# Patient Record
Sex: Female | Born: 1955
Health system: Southern US, Community
[De-identification: ages and names within clinical notes are randomized; demographics above are authoritative.]

## PROBLEM LIST (undated history)

## (undated) DIAGNOSIS — Z9889 Other specified postprocedural states: Secondary | ICD-10-CM

## (undated) DIAGNOSIS — R112 Nausea with vomiting, unspecified: Secondary | ICD-10-CM

## (undated) DIAGNOSIS — T7840XA Allergy, unspecified, initial encounter: Secondary | ICD-10-CM

## (undated) DIAGNOSIS — M1990 Unspecified osteoarthritis, unspecified site: Secondary | ICD-10-CM

## (undated) DIAGNOSIS — E785 Hyperlipidemia, unspecified: Secondary | ICD-10-CM

## (undated) DIAGNOSIS — K219 Gastro-esophageal reflux disease without esophagitis: Secondary | ICD-10-CM

## (undated) HISTORY — PX: COLONOSCOPY: SHX174

## (undated) HISTORY — PX: OTHER SURGICAL HISTORY: SHX169

## (undated) HISTORY — DX: Unspecified osteoarthritis, unspecified site: M19.90

## (undated) HISTORY — DX: Allergy, unspecified, initial encounter: T78.40XA

## (undated) HISTORY — PX: TONSILLECTOMY: SUR1361

## (undated) HISTORY — DX: Gastro-esophageal reflux disease without esophagitis: K21.9

## (undated) HISTORY — PX: JOINT REPLACEMENT: SHX530

## (undated) HISTORY — PX: MENISCUS REPAIR: SHX5179

---

## 2004-07-10 ENCOUNTER — Ambulatory Visit: Payer: Self-pay | Admitting: Unknown Physician Specialty

## 2006-02-11 ENCOUNTER — Ambulatory Visit: Payer: Self-pay | Admitting: Unknown Physician Specialty

## 2007-05-26 ENCOUNTER — Ambulatory Visit: Payer: Self-pay | Admitting: Unknown Physician Specialty

## 2007-05-30 ENCOUNTER — Ambulatory Visit: Payer: Self-pay | Admitting: Unknown Physician Specialty

## 2007-07-12 ENCOUNTER — Emergency Department: Payer: Self-pay | Admitting: Emergency Medicine

## 2008-09-25 ENCOUNTER — Ambulatory Visit: Payer: Self-pay | Admitting: Unknown Physician Specialty

## 2009-10-18 ENCOUNTER — Ambulatory Visit: Payer: Self-pay | Admitting: Unknown Physician Specialty

## 2011-02-10 ENCOUNTER — Ambulatory Visit: Payer: Self-pay | Admitting: Unknown Physician Specialty

## 2013-08-22 ENCOUNTER — Ambulatory Visit: Payer: Self-pay | Admitting: Specialist

## 2013-09-05 ENCOUNTER — Ambulatory Visit: Payer: Self-pay | Admitting: Specialist

## 2015-01-11 NOTE — Op Note (Signed)
PATIENT NAME:  [name], [name] MR#:  [id number] DATE OF BIRTH:  [birth year]  DATE OF PROCEDURE:  09/05/2013  PREOPERATIVE DIAGNOSIS: Medial meniscus tear, left knee.   POSTOPERATIVE DIAGNOSIS: Complex tear, mid and posterior horn, medial meniscus, left knee.   PROCEDURE: Arthroscopic partial medial meniscectomy, left knee.   SURGEON: Lucas Mallow, MD  ANESTHESIA: General.   COMPLICATIONS: None.   DESCRIPTION OF PROCEDURE: After adequate induction of general anesthesia, the left lower extremity is placed in the legholder in the usual manner for knee arthroscopy. The left knee and leg are thoroughly prepped with ChloraPrep and alcohol and draped in standard sterile fashion. Diagnostic arthroscopy is performed. Prior to this, 10 mL of Marcaine with epinephrine was infiltrated into the joint. Diagnostic arthroscopy demonstrated some mild chondromalacia on the anterior aspect of the femur. The lateral compartment is normal. The anterior cruciate ligament is normal. There is a moderate amount of synovial hypertrophy in the intercondylar notch. In the medial compartment, there is mild to moderate chondromalacia of the medial femoral condyle surface associated with a complex large tear of the midportion and posterior horns of the medial meniscus. Using a combination of the full radial resector and the ArthroWand, the torn portion of the meniscus was resected back to a stable rim. The probe is used to demonstrate that no further areas of tear are seen. The wound is thoroughly irrigated multiple times until clear. Portals are closed with 4-0 nylon. The joint is infiltrated with 15 mL of Marcaine with epinephrine and 4 mg of morphine. A soft bulky dressing is applied. The patient is returned to the recovery room in satisfactory condition, having tolerated the procedure quite well.  ____________________________ Lucas Mallow, MD ces:lb D: 09/05/2013 11:00:08 ET T: 09/05/2013 11:15:26  ET JOB#: [id number]  cc: Lucas Mallow, MD, <Dictator> Lucas Mallow MD ELECTRONICALLY SIGNED 09/07/2013 12:32

## 2016-07-20 DIAGNOSIS — Z96659 Presence of unspecified artificial knee joint: Secondary | ICD-10-CM | POA: Insufficient documentation

## 2016-12-16 NOTE — Progress Notes (Signed)
Subjective:    Patient ID: Ellen Montoya, female    DOB: 11-Jan-1956, 61 y.o.   MRN: [id number]  CC: EDIA PURSIFULL is a 61 y.o. female who presents today to establish care.    HPI: New patient. Bounced after [employer] retired.   Arthritis- primarily  left knee ( had partial left knee replacement) and also in her hands. Feels stiff, especially with stairs. No swelling, fever.   Epigastric burning - noticed in pilates when laying on stomach, takes priolosec PRN with resolve. NO CP, sob, unintentional weight loss, or trouble swallowing pills.   Allergies- controlled; follows with ENT  Will return for CPE, pap, and labs             HISTORY:  Past Medical History:  Diagnosis Date  . Allergy   . Arthritis    No past surgical history on file. Family History  Problem Relation Age of Onset  . Heart disease Father   . Heart disease Maternal Grandmother     Allergies: Patient has no known allergies. No current outpatient prescriptions on file prior to visit.   No current facility-administered medications on file prior to visit.     Social History  Substance Use Topics  . Smoking status: Never Smoker  . Smokeless tobacco: Never Used  . Alcohol use No    Review of Systems  Constitutional: Negative for chills and fever.  Respiratory: Negative for cough and shortness of breath.   Cardiovascular: Negative for chest pain and palpitations.  Gastrointestinal: Negative for nausea and vomiting.  Musculoskeletal: Negative for joint swelling.      Objective:    BP 122/68 (BP Location: Left Arm, Patient Position: Sitting, Cuff Size: Normal)   Pulse 88   Temp 98.2 F (36.8 C) (Oral)   Resp 16   Ht 5\' 4"  (1.626 m)   Wt 148 lb 4 oz (67.2 kg)   SpO2 97%   BMI 25.45 kg/m  BP Readings from Last 3 Encounters:  12/17/16 122/68   Wt Readings from Last 3 Encounters:  12/17/16 148 lb 4 oz (67.2 kg)    Physical Exam  Constitutional: She appears well-developed and  well-nourished.  Eyes: Conjunctivae are normal.  Cardiovascular: Normal rate, regular rhythm, normal heart sounds and normal pulses.   Pulmonary/Chest: Effort normal and breath sounds normal. She has no wheezes. She has no rhonchi. She has no rales.  Neurological: She is alert.  Skin: Skin is warm and dry.  Psychiatric: She has a normal mood and affect. Her speech is normal and behavior is normal. Thought content normal.  Vitals reviewed.      Assessment & Plan:   Problem List Items Addressed This Visit      Respiratory   Seasonal allergies    Follows with Dr Tami Ribas. Controlled on current regimen        Digestive   GERD (gastroesophageal reflux disease)    Advised zantac for prn use over PPI if symptoms can be controlled. Discussed lifestyle modifications        Musculoskeletal and Integument   Osteoarthritis    General of knee and hands. Trial mobic. Stop otc nsaids      Relevant Medications   meloxicam (MOBIC) 7.5 MG tablet     Other   Screening for breast cancer - Primary    Ordered ; patient will schedule      Relevant Orders   MM DIGITAL SCREENING BILATERAL   Encounter for immunization   Relevant Orders  Flu Vaccine QUAD 36+ mos IM (Completed)       I have discontinued Ms. Demary's naproxen sodium and omeprazole. I am also having her start on meloxicam. Additionally, I am having her maintain her fluticasone, ADVAIR DISKUS, cetirizine, and calcium carbonate.   Meds ordered this encounter  Medications  . DISCONTD: naproxen sodium (ANAPROX) 220 MG tablet    Sig: Take 220 mg by mouth 2 (two) times daily with a meal.  . DISCONTD: omeprazole (PRILOSEC) 20 MG capsule    Sig: Take 20 mg by mouth daily.  . fluticasone (FLONASE) 50 MCG/ACT nasal spray    Sig: SPRAY ONCE IN EACH NOSTRIL ONCE DAILY    Refill:  10  . ADVAIR DISKUS 250-50 MCG/DOSE AEPB    Sig: INHALE 1 PUFF INTO THE LUNGS TWICE A DAY    Refill:  5  . cetirizine (ZYRTEC) 10 MG tablet    Sig:  Take 10 mg by mouth daily.  . calcium carbonate (OS-CAL) 1250 (500 Ca) MG chewable tablet    Sig: Chew 1 tablet by mouth daily.  . meloxicam (MOBIC) 7.5 MG tablet    Sig: Take 1 tablet (7.5 mg total) by mouth daily.    Dispense:  90 tablet    Refill:  0    Order Specific Question:   Supervising Provider    Answer:   Crecencio Mc [[id number]]    Return precautions given.   Risks, benefits, and alternatives of the medications and treatment plan prescribed today were discussed, and patient expressed understanding.   Education regarding symptom management and diagnosis given to patient on AVS.  Continue to follow with Mable Paris, FNP for routine health maintenance.   Ellen Montoya and I agreed with plan.   Mable Paris, FNP

## 2016-12-17 ENCOUNTER — Ambulatory Visit (INDEPENDENT_AMBULATORY_CARE_PROVIDER_SITE_OTHER): Payer: BLUE CROSS/BLUE SHIELD | Admitting: Family

## 2016-12-17 ENCOUNTER — Encounter: Payer: Self-pay | Admitting: Family

## 2016-12-17 ENCOUNTER — Telehealth: Payer: Self-pay

## 2016-12-17 VITALS — BP 122/68 | HR 88 | Temp 98.2°F | Resp 16 | Ht 64.0 in | Wt 148.2 lb

## 2016-12-17 DIAGNOSIS — Z0000 Encounter for general adult medical examination without abnormal findings: Secondary | ICD-10-CM

## 2016-12-17 DIAGNOSIS — M1990 Unspecified osteoarthritis, unspecified site: Secondary | ICD-10-CM | POA: Insufficient documentation

## 2016-12-17 DIAGNOSIS — Z1239 Encounter for other screening for malignant neoplasm of breast: Secondary | ICD-10-CM | POA: Insufficient documentation

## 2016-12-17 DIAGNOSIS — K219 Gastro-esophageal reflux disease without esophagitis: Secondary | ICD-10-CM

## 2016-12-17 DIAGNOSIS — J302 Other seasonal allergic rhinitis: Secondary | ICD-10-CM | POA: Diagnosis not present

## 2016-12-17 DIAGNOSIS — Z23 Encounter for immunization: Secondary | ICD-10-CM

## 2016-12-17 DIAGNOSIS — M1711 Unilateral primary osteoarthritis, right knee: Secondary | ICD-10-CM | POA: Diagnosis not present

## 2016-12-17 DIAGNOSIS — Z1231 Encounter for screening mammogram for malignant neoplasm of breast: Secondary | ICD-10-CM | POA: Diagnosis not present

## 2016-12-17 HISTORY — DX: Encounter for general adult medical examination without abnormal findings: Z00.00

## 2016-12-17 MED ORDER — MELOXICAM 7.5 MG PO TABS: 7.5000 mg | ORAL_TABLET | Freq: Every day | ORAL | 0 refills | Status: DC

## 2016-12-17 NOTE — Assessment & Plan Note (Signed)
Ordered; patient will schedule.  

## 2016-12-17 NOTE — Patient Instructions (Addendum)
Try zantac over the omeprazole and see if acid reflux symptoms are controlled  Trial of mobic for osteoarthritis. You may stop over the counter meds- aleve, naprosyn.  Follow up for physical   We placed a referral. Mammogram this year. I asked that you call one the below locations and schedule this when it is convenient for you.   If you have dense breasts, you may ask for 3D mammogram over the traditional 2D mammogram as new evidence suggest 3D is superior. Please note that NOT all insurance companies cover 3D and you may have to pay a higher copay. You may call your insurance company to further clarify your benefits.   Options for [place]  [place], [place]  * [facility] [place] * Note if you ask for 3D mammogram at this location, you must request [place], [place] location*

## 2016-12-17 NOTE — Telephone Encounter (Signed)
error 

## 2016-12-17 NOTE — Assessment & Plan Note (Signed)
General of knee and hands. Trial mobic. Stop otc nsaids

## 2016-12-17 NOTE — Assessment & Plan Note (Signed)
Advised zantac for prn use over PPI if symptoms can be controlled. Discussed lifestyle modifications

## 2016-12-17 NOTE — Assessment & Plan Note (Addendum)
Follows with Dr Tami Ribas. Controlled on current regimen

## 2016-12-17 NOTE — Progress Notes (Signed)
Pre visit review using our clinic review tool, if applicable. No additional management support is needed unless otherwise documented below in the visit note. 

## 2017-01-18 ENCOUNTER — Ambulatory Visit
Admission: RE | Admit: 2017-01-18 | Discharge: 2017-01-18 | Disposition: A | Discharge status: Home / Self Care | Payer: BLUE CROSS/BLUE SHIELD | Source: Ambulatory Visit | Attending: Family | Admitting: Family

## 2017-01-18 DIAGNOSIS — Z1239 Encounter for other screening for malignant neoplasm of breast: Secondary | ICD-10-CM

## 2017-01-18 DIAGNOSIS — Z1231 Encounter for screening mammogram for malignant neoplasm of breast: Secondary | ICD-10-CM | POA: Diagnosis not present

## 2017-01-21 ENCOUNTER — Other Ambulatory Visit (HOSPITAL_COMMUNITY)
Admission: RE | Admit: 2017-01-21 | Discharge: 2017-01-21 | Disposition: A | Discharge status: Home / Self Care | Payer: BLUE CROSS/BLUE SHIELD | Source: Ambulatory Visit | Attending: Family | Admitting: Family

## 2017-01-21 ENCOUNTER — Ambulatory Visit (INDEPENDENT_AMBULATORY_CARE_PROVIDER_SITE_OTHER): Payer: BLUE CROSS/BLUE SHIELD | Admitting: Family

## 2017-01-21 ENCOUNTER — Inpatient Hospital Stay
Admission: RE | Admit: 2017-01-21 | Discharge: 2017-01-21 | Disposition: A | Discharge status: Home / Self Care | Payer: Self-pay | Source: Ambulatory Visit | Attending: *Deleted | Admitting: *Deleted

## 2017-01-21 ENCOUNTER — Other Ambulatory Visit: Payer: Self-pay | Admitting: *Deleted

## 2017-01-21 ENCOUNTER — Encounter: Payer: Self-pay | Admitting: Family

## 2017-01-21 VITALS — BP 128/78 | HR 88 | Temp 98.0°F | Ht 64.0 in | Wt 147.0 lb

## 2017-01-21 DIAGNOSIS — Z9289 Personal history of other medical treatment: Secondary | ICD-10-CM

## 2017-01-21 DIAGNOSIS — Z0000 Encounter for general adult medical examination without abnormal findings: Secondary | ICD-10-CM | POA: Insufficient documentation

## 2017-01-21 NOTE — Progress Notes (Signed)
Subjective:    Patient ID: Ellen Montoya, female    DOB: 1956-06-13, 61 y.o.   MRN: [id number]  CC: Ellen Montoya is a 61 y.o. female who presents today for physical exam.    HPI: Feeling well.  No compliants today.     Colorectal Cancer Screening: 12/2010 per patient. Unsure when she is due for repeat.  Breast Cancer Screening: Mammogram UTD Cervical Cancer Screening: due Bone Health screening/DEXA for 65+: No increased fracture risk. Defer screening at this time. Lung Cancer Screening: Doesn't have 30 year pack year history and age > 14 years.       Tetanus - due        Hepatitis C screening - Candidate for; will consider once she calls insurance company HIV Screening- Candidate for ; will consider once she calls insurance company Labs: Screening labs today. Exercise: Gets regular exercise.  Alcohol use: No Smoking/tobacco use: Nonsmoker.  Regular dental exams: UTD Wears seat belt: Yes. Skin: follows with derm   HISTORY:  Past Medical History:  Diagnosis Date  . Allergy   . Arthritis     No past surgical history on file. Family History  Problem Relation Age of Onset  . Heart disease Father     thinks a MI  . Heart disease Maternal Grandmother   . Leukemia Mother   . Ovarian cancer Neg Hx   . Colon cancer Neg Hx       ALLERGIES: Patient has no known allergies.  Current Outpatient Prescriptions on File Prior to Visit  Medication Sig Dispense Refill  . ADVAIR DISKUS 250-50 MCG/DOSE AEPB INHALE 1 PUFF INTO THE LUNGS TWICE A DAY  5  . calcium carbonate (OS-CAL) 1250 (500 Ca) MG chewable tablet Chew 1 tablet by mouth daily.    . cetirizine (ZYRTEC) 10 MG tablet Take 10 mg by mouth daily.    . fluticasone (FLONASE) 50 MCG/ACT nasal spray SPRAY ONCE IN EACH NOSTRIL ONCE DAILY  10  . meloxicam (MOBIC) 7.5 MG tablet Take 1 tablet (7.5 mg total) by mouth daily. 90 tablet 0   No current facility-administered medications on file prior to visit.     Social  History  Substance Use Topics  . Smoking status: Never Smoker  . Smokeless tobacco: Never Used  . Alcohol use No    Review of Systems  Constitutional: Negative for chills, fever and unexpected weight change.  HENT: Negative for congestion.   Respiratory: Negative for cough.   Cardiovascular: Negative for chest pain, palpitations and leg swelling.  Gastrointestinal: Negative for nausea and vomiting.  Musculoskeletal: Negative for arthralgias and myalgias.  Skin: Negative for rash.  Neurological: Negative for headaches.  Hematological: Negative for adenopathy.  Psychiatric/Behavioral: Negative for confusion.      Objective:    BP 128/78   Pulse 88   Temp 98 F (36.7 C) (Oral)   Ht 5\' 4"  (1.626 m)   Wt 147 lb (66.7 kg)   SpO2 96%   BMI 25.23 kg/m   BP Readings from Last 3 Encounters:  01/21/17 128/78  12/17/16 122/68   Wt Readings from Last 3 Encounters:  01/21/17 147 lb (66.7 kg)  12/17/16 148 lb 4 oz (67.2 kg)    Physical Exam  Constitutional: She appears well-developed and well-nourished.  Eyes: Conjunctivae are normal.  Neck: No thyroid mass and no thyromegaly present.  Cardiovascular: Normal rate, regular rhythm, normal heart sounds and normal pulses.   Pulmonary/Chest: Effort normal and breath sounds normal. She  has no wheezes. She has no rhonchi. She has no rales. Right breast exhibits no inverted nipple, no mass, no nipple discharge, no skin change and no tenderness. Left breast exhibits no inverted nipple, no mass, no nipple discharge, no skin change and no tenderness. Breasts are symmetrical.  No masses or asymmetry appreciated during CBE.  Genitourinary: Uterus is not enlarged, not fixed and not tender. Cervix exhibits no motion tenderness, no discharge and no friability. Right adnexum displays no mass, no tenderness and no fullness. Left adnexum displays no mass, no tenderness and no fullness.  Genitourinary Comments: Pap performed. No CMT. Unable to  appreciated ovaries.  Lymphadenopathy:       Head (right side): No submental, no submandibular, no tonsillar, no preauricular, no posterior auricular and no occipital adenopathy present.       Head (left side): No submental, no submandibular, no tonsillar, no preauricular, no posterior auricular and no occipital adenopathy present.       Right cervical: No superficial cervical, no deep cervical and no posterior cervical adenopathy present.      Left cervical: No superficial cervical, no deep cervical and no posterior cervical adenopathy present.    She has no axillary adenopathy.       Right axillary: No pectoral and no lateral adenopathy present.       Left axillary: No pectoral and no lateral adenopathy present. Neurological: She is alert.  Skin: Skin is warm and dry.  Psychiatric: She has a normal mood and affect. Her speech is normal and behavior is normal. Thought content normal.  Vitals reviewed.      Assessment & Plan:   Problem List Items Addressed This Visit      Other   Routine physical examination - Primary    CBE and pap performed. Encouraged Tdap, [employer] at [employer] [facility]. Screening labs ordered. Will let me know when colonoscopy is due ( cannot see report). Encouraged continued exercise.       Relevant Orders   CBC with Differential/Platelet   Comprehensive metabolic panel   Hemoglobin A1c   Lipid panel   TSH   VITAMIN D 25 Hydroxy (Vit-D Deficiency, Fractures)   Cytology - PAP       I am having Ellen Montoya maintain her fluticasone, ADVAIR DISKUS, cetirizine, calcium carbonate, and meloxicam.   No orders of the defined types were placed in this encounter.   Return precautions given.   Risks, benefits, and alternatives of the medications and treatment plan prescribed today were discussed, and patient expressed understanding.   Education regarding symptom management and diagnosis given to patient on AVS.   Continue to follow with Ellen Paris, Ellen Montoya for  routine health maintenance.   Ellen Montoya and I agreed with plan.   Ellen Paris, Ellen Montoya

## 2017-01-21 NOTE — Assessment & Plan Note (Signed)
CBE and pap performed. Encouraged Tdap, [employer] at [facility]. Screening labs ordered. Will let me know when colonoscopy is due ( cannot see report). Encouraged continued exercise.

## 2017-01-21 NOTE — Patient Instructions (Addendum)
Please let me know when your next colonoscopy is due.   Tdap and Shingrex at your local pharmacy  Please let me know when you come for fasting  Labs if you would like to include HIV and hepatitis C screen.     Health Maintenance for Postmenopausal Women Menopause is a normal process in which your reproductive ability comes to an end. This process happens gradually over a span of months to years, usually between the ages of 19 and 23. Menopause is complete when you have missed 12 consecutive menstrual periods. It is important to talk with your health care provider about some of the most common conditions that affect postmenopausal women, such as heart disease, cancer, and bone loss (osteoporosis). Adopting a healthy lifestyle and getting preventive care can help to promote your health and wellness. Those actions can also lower your chances of developing some of these common conditions. What should I know about menopause? During menopause, you may experience a number of symptoms, such as:  Moderate-to-severe hot flashes.  Night sweats.  Decrease in sex drive.  Mood swings.  Headaches.  Tiredness.  Irritability.  Memory problems.  Insomnia. Choosing to treat or not to treat menopausal changes is an individual decision that you make with your health care provider. What should I know about hormone replacement therapy and supplements? Hormone therapy products are effective for treating symptoms that are associated with menopause, such as hot flashes and night sweats. Hormone replacement carries certain risks, especially as you become older. If you are thinking about using estrogen or estrogen with progestin treatments, discuss the benefits and risks with your health care provider. What should I know about heart disease and stroke? Heart disease, heart attack, and stroke become more likely as you age. This may be due, in part, to the hormonal changes that your body experiences during  menopause. These can affect how your body processes dietary fats, triglycerides, and cholesterol. Heart attack and stroke are both medical emergencies. There are many things that you can do to help prevent heart disease and stroke:  Have your blood pressure checked at least every 1-2 years. High blood pressure causes heart disease and increases the risk of stroke.  If you are 8-31 years old, ask your health care provider if you should take aspirin to prevent a heart attack or a stroke.  Do not use any tobacco products, including cigarettes, chewing tobacco, or electronic cigarettes. If you need help quitting, ask your health care provider.  It is important to eat a healthy diet and maintain a healthy weight.  Be sure to include plenty of vegetables, fruits, low-fat dairy products, and lean protein.  Avoid eating foods that are high in solid fats, added sugars, or salt (sodium).  Get regular exercise. This is one of the most important things that you can do for your health.  Try to exercise for at least 150 minutes each week. The type of exercise that you do should increase your heart rate and make you sweat. This is known as moderate-intensity exercise.  Try to do strengthening exercises at least twice each week. Do these in addition to the moderate-intensity exercise.  Know your numbers.Ask your health care provider to check your cholesterol and your blood glucose. Continue to have your blood tested as directed by your health care provider. What should I know about cancer screening? There are several types of cancer. Take the following steps to reduce your risk and to catch any cancer development as early as  possible. Breast Cancer  Practice breast self-awareness.  This means understanding how your breasts normally appear and feel.  It also means doing regular breast self-exams. Let your health care provider know about any changes, no matter how small.  If you are 7 or older, have  a clinician do a breast exam (clinical breast exam or CBE) every year. Depending on your age, family history, and medical history, it may be recommended that you also have a yearly breast X-ray (mammogram).  If you have a family history of breast cancer, talk with your health care provider about genetic screening.  If you are at high risk for breast cancer, talk with your health care provider about having an MRI and a mammogram every year.  Breast cancer (BRCA) gene test is recommended for women who have family members with BRCA-related cancers. Results of the assessment will determine the need for genetic counseling and BRCA1 and for BRCA2 testing. BRCA-related cancers include these types:  Breast. This occurs in males or females.  Ovarian.  Tubal. This may also be called fallopian tube cancer.  Cancer of the abdominal or pelvic lining (peritoneal cancer).  Prostate.  Pancreatic. Cervical, Uterine, and Ovarian Cancer  Your health care provider may recommend that you be screened regularly for cancer of the pelvic organs. These include your ovaries, uterus, and vagina. This screening involves a pelvic exam, which includes checking for microscopic changes to the surface of your cervix (Pap test).  For women ages 21-65, health care providers may recommend a pelvic exam and a Pap test every three years. For women ages 62-65, they may recommend the Pap test and pelvic exam, combined with testing for human papilloma virus (HPV), every five years. Some types of HPV increase your risk of cervical cancer. Testing for HPV may also be done on women of any age who have unclear Pap test results.  Other health care providers may not recommend any screening for nonpregnant women who are considered low risk for pelvic cancer and have no symptoms. Ask your health care provider if a screening pelvic exam is right for you.  If you have had past treatment for cervical cancer or a condition that could lead to  cancer, you need Pap tests and screening for cancer for at least 20 years after your treatment. If Pap tests have been discontinued for you, your risk factors (such as having a new sexual partner) need to be reassessed to determine if you should start having screenings again. Some women have medical problems that increase the chance of getting cervical cancer. In these cases, your health care provider may recommend that you have screening and Pap tests more often.  If you have a family history of uterine cancer or ovarian cancer, talk with your health care provider about genetic screening.  If you have vaginal bleeding after reaching menopause, tell your health care provider.  There are currently no reliable tests available to screen for ovarian cancer. Lung Cancer  Lung cancer screening is recommended for adults 70-58 years old who are at high risk for lung cancer because of a history of smoking. A yearly low-dose CT scan of the lungs is recommended if you:  Currently smoke.  Have a history of at least 30 pack-years of smoking and you currently smoke or have quit within the past 15 years. A pack-year is smoking an average of one pack of cigarettes per day for one year. Yearly screening should:  Continue until it has been 15 years since  you quit.  Stop if you develop a health problem that would prevent you from having lung cancer treatment. Colorectal Cancer  This type of cancer can be detected and can often be prevented.  Routine colorectal cancer screening usually begins at age 3 and continues through age 36.  If you have risk factors for colon cancer, your health care provider may recommend that you be screened at an earlier age.  If you have a family history of colorectal cancer, talk with your health care provider about genetic screening.  Your health care provider may also recommend using home test kits to check for hidden blood in your stool.  A small camera at the end of a tube  can be used to examine your colon directly (sigmoidoscopy or colonoscopy). This is done to check for the earliest forms of colorectal cancer.  Direct examination of the colon should be repeated every 5-10 years until age 50. However, if early forms of precancerous polyps or small growths are found or if you have a family history or genetic risk for colorectal cancer, you may need to be screened more often. Skin Cancer  Check your skin from head to toe regularly.  Monitor any moles. Be sure to tell your health care provider:  About any new moles or changes in moles, especially if there is a change in a mole's shape or color.  If you have a mole that is larger than the size of a pencil eraser.  If any of your family members has a history of skin cancer, especially at a young age, talk with your health care provider about genetic screening.  Always use sunscreen. Apply sunscreen liberally and repeatedly throughout the day.  Whenever you are outside, protect yourself by wearing long sleeves, pants, a wide-brimmed hat, and sunglasses. What should I know about osteoporosis? Osteoporosis is a condition in which bone destruction happens more quickly than new bone creation. After menopause, you may be at an increased risk for osteoporosis. To help prevent osteoporosis or the bone fractures that can happen because of osteoporosis, the following is recommended:  If you are 10-56 years old, get at least 1,000 mg of calcium and at least 600 mg of vitamin D per day.  If you are older than age 63 but younger than age 2, get at least 1,200 mg of calcium and at least 600 mg of vitamin D per day.  If you are older than age 82, get at least 1,200 mg of calcium and at least 800 mg of vitamin D per day. Smoking and excessive alcohol intake increase the risk of osteoporosis. Eat foods that are rich in calcium and vitamin D, and do weight-bearing exercises several times each week as directed by your health care  provider. What should I know about how menopause affects my mental health? Depression may occur at any age, but it is more common as you become older. Common symptoms of depression include:  Low or sad mood.  Changes in sleep patterns.  Changes in appetite or eating patterns.  Feeling an overall lack of motivation or enjoyment of activities that you previously enjoyed.  Frequent crying spells. Talk with your health care provider if you think that you are experiencing depression. What should I know about immunizations? It is important that you get and maintain your immunizations. These include:  Tetanus, diphtheria, and pertussis (Tdap) booster vaccine.  Influenza every year before the flu season begins.  Pneumonia vaccine.  Shingles vaccine. Your health care provider  may also recommend other immunizations. This information is not intended to replace advice given to you by your health care provider. Make sure you discuss any questions you have with your health care provider. Document Released: 10/30/2005 Document Revised: 03/27/2016 Document Reviewed: 06/11/2015 Elsevier Interactive Patient Education  2017 [employer].

## 2017-01-21 NOTE — Progress Notes (Signed)
Pre visit review using our clinic review tool, if applicable. No additional management support is needed unless otherwise documented below in the visit note. 

## 2017-01-25 LAB — CYTOLOGY - PAP: HPV: NOT DETECTED

## 2017-02-03 ENCOUNTER — Other Ambulatory Visit: Payer: Self-pay | Admitting: Family

## 2017-02-03 DIAGNOSIS — N952 Postmenopausal atrophic vaginitis: Secondary | ICD-10-CM

## 2017-02-03 MED ORDER — ESTRADIOL 0.1 MG/GM VA CREA: TOPICAL_CREAM | VAGINAL | 1 refills | Status: DC

## 2017-02-03 NOTE — Progress Notes (Unsigned)
Call pt  I have ordered 3 month supply vaginal estrogen  Please make repeat pap appt in 3 months

## 2017-02-04 ENCOUNTER — Telehealth: Payer: Self-pay | Admitting: *Deleted

## 2017-02-04 ENCOUNTER — Other Ambulatory Visit (INDEPENDENT_AMBULATORY_CARE_PROVIDER_SITE_OTHER): Payer: BLUE CROSS/BLUE SHIELD

## 2017-02-04 DIAGNOSIS — Z0000 Encounter for general adult medical examination without abnormal findings: Secondary | ICD-10-CM

## 2017-02-04 LAB — LIPID PANEL
Cholesterol: 185 mg/dL (ref 0–200)
HDL: 57.1 mg/dL (ref >39.00)
LDL Cholesterol: 102 mg/dL — ABNORMAL HIGH (ref 0–99)
NonHDL: 127.42
Total CHOL/HDL Ratio: 3
Triglycerides: 126 mg/dL (ref 0.0–149.0)
VLDL: 25.2 mg/dL (ref 0.0–40.0)

## 2017-02-04 LAB — CBC WITH DIFFERENTIAL/PLATELET
Basophils Absolute: 0.1 10*3/uL (ref 0.0–0.1)
Basophils Relative: 1.2 % (ref 0.0–3.0)
Eosinophils Absolute: 0.1 10*3/uL (ref 0.0–0.7)
Eosinophils Relative: 2 % (ref 0.0–5.0)
HCT: 40.2 % (ref 36.0–46.0)
Hemoglobin: 13.4 g/dL (ref 12.0–15.0)
Lymphocytes Relative: 26 % (ref 12.0–46.0)
Lymphs Abs: 1.6 10*3/uL (ref 0.7–4.0)
MCHC: 33.4 g/dL (ref 30.0–36.0)
MCV: 90 fl (ref 78.0–100.0)
Monocytes Absolute: 0.5 10*3/uL (ref 0.1–1.0)
Monocytes Relative: 8.4 % (ref 3.0–12.0)
Neutro Abs: 3.8 10*3/uL (ref 1.4–7.7)
Neutrophils Relative %: 62.4 % (ref 43.0–77.0)
Platelets: 146 10*3/uL — ABNORMAL LOW (ref 150.0–400.0)
RBC: 4.46 Mil/uL (ref 3.87–5.11)
RDW: 12.8 % (ref 11.5–15.5)
WBC: 6.1 10*3/uL (ref 4.0–10.5)

## 2017-02-04 LAB — VITAMIN D 25 HYDROXY (VIT D DEFICIENCY, FRACTURES): VITD: 37.92 ng/mL (ref 30.00–100.00)

## 2017-02-04 LAB — COMPREHENSIVE METABOLIC PANEL
ALT: 15 U/L (ref 0–35)
AST: 15 U/L (ref 0–37)
Albumin: 4.5 g/dL (ref 3.5–5.2)
Alkaline Phosphatase: 74 U/L (ref 39–117)
BUN: 15 mg/dL (ref 6–23)
CO2: 30 mEq/L (ref 19–32)
Calcium: 9.6 mg/dL (ref 8.4–10.5)
Chloride: 105 mEq/L (ref 96–112)
Creatinine, Ser: 0.7 mg/dL (ref 0.40–1.20)
GFR: 89.9 mL/min (ref >60.00)
Glucose, Bld: 103 mg/dL — ABNORMAL HIGH (ref 70–99)
Potassium: 4 mEq/L (ref 3.5–5.1)
Sodium: 140 mEq/L (ref 135–145)
Total Bilirubin: 0.9 mg/dL (ref 0.2–1.2)
Total Protein: 7.1 g/dL (ref 6.0–8.3)

## 2017-02-04 LAB — TSH: TSH: 1.82 u[IU]/mL (ref 0.35–4.50)

## 2017-02-04 LAB — HEMOGLOBIN A1C: Hgb A1c MFr Bld: 5.8 % (ref 4.6–6.5)

## 2017-02-04 NOTE — Progress Notes (Signed)
Patient has been notified.  Patient had no questions, comments, or concerns at this time.

## 2017-02-04 NOTE — Telephone Encounter (Signed)
See additional note, thanks 

## 2017-02-04 NOTE — Telephone Encounter (Signed)
Patient stated that she missed a call from [facility] this morning  Pt contact [phone]

## 2017-02-04 NOTE — Progress Notes (Signed)
Attempted to reach the patient, she had left her secondary number to call, left a VM.

## 2017-03-17 ENCOUNTER — Other Ambulatory Visit: Payer: Self-pay | Admitting: Family

## 2017-03-17 DIAGNOSIS — M1711 Unilateral primary osteoarthritis, right knee: Secondary | ICD-10-CM

## 2017-03-26 ENCOUNTER — Other Ambulatory Visit: Payer: Self-pay

## 2017-03-26 DIAGNOSIS — M1711 Unilateral primary osteoarthritis, right knee: Secondary | ICD-10-CM

## 2017-03-26 MED ORDER — MELOXICAM 7.5 MG PO TABS: 7.5000 mg | ORAL_TABLET | Freq: Every day | ORAL | 0 refills | Status: DC

## 2018-04-06 DIAGNOSIS — M533 Sacrococcygeal disorders, not elsewhere classified: Secondary | ICD-10-CM | POA: Insufficient documentation

## 2018-08-11 IMAGING — MG MM DIGITAL SCREENING BILAT W/ TOMO W/ CAD
8 of 12 series · 8 of 28 positions shown · non-contrast
Comparison: Previous exam(s).

CLINICAL DATA: Screening.

EXAM:
2D DIGITAL SCREENING BILATERAL MAMMOGRAM WITH CAD AND ADJUNCT TOMO

[L MLO synth-2D]
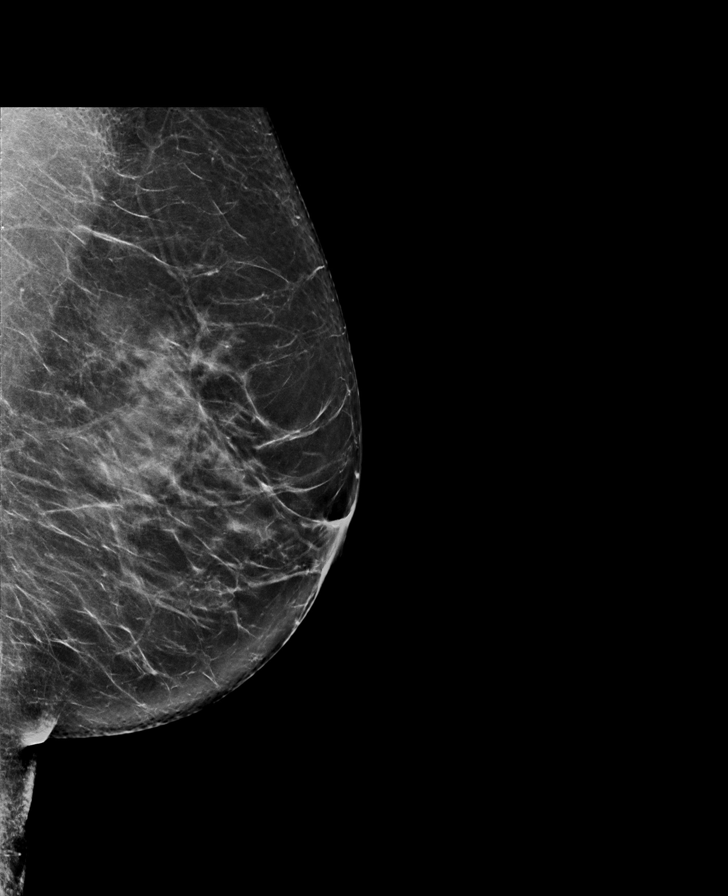

[R MLO]
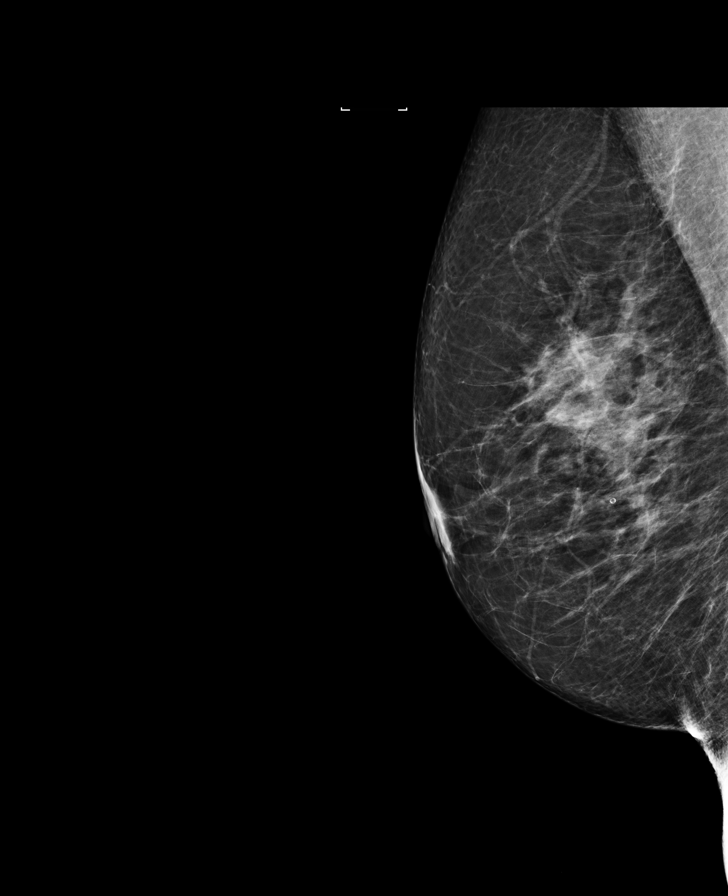

[L CC]
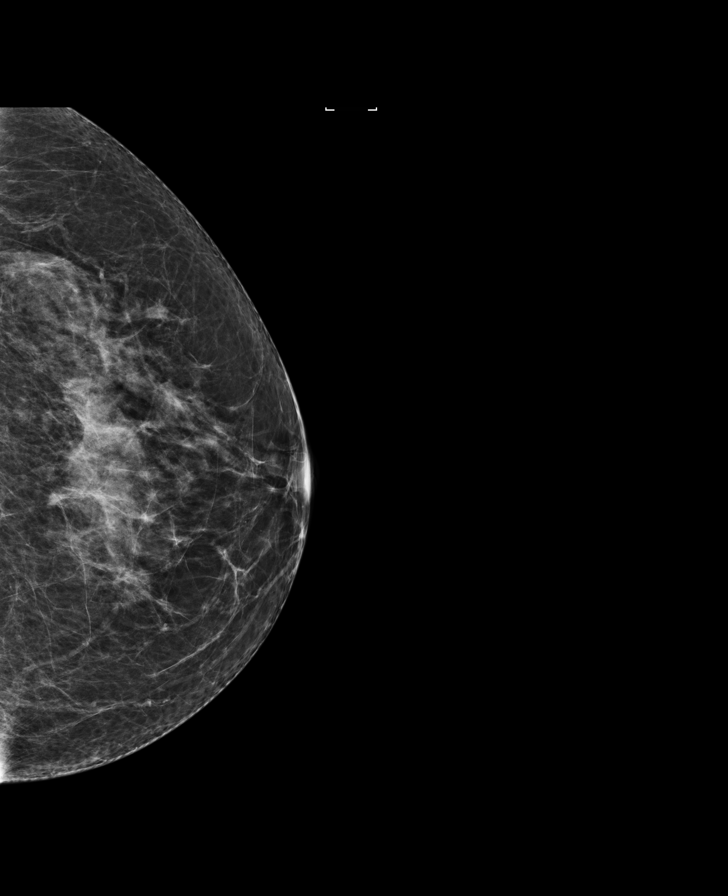

[R CC synth-2D]
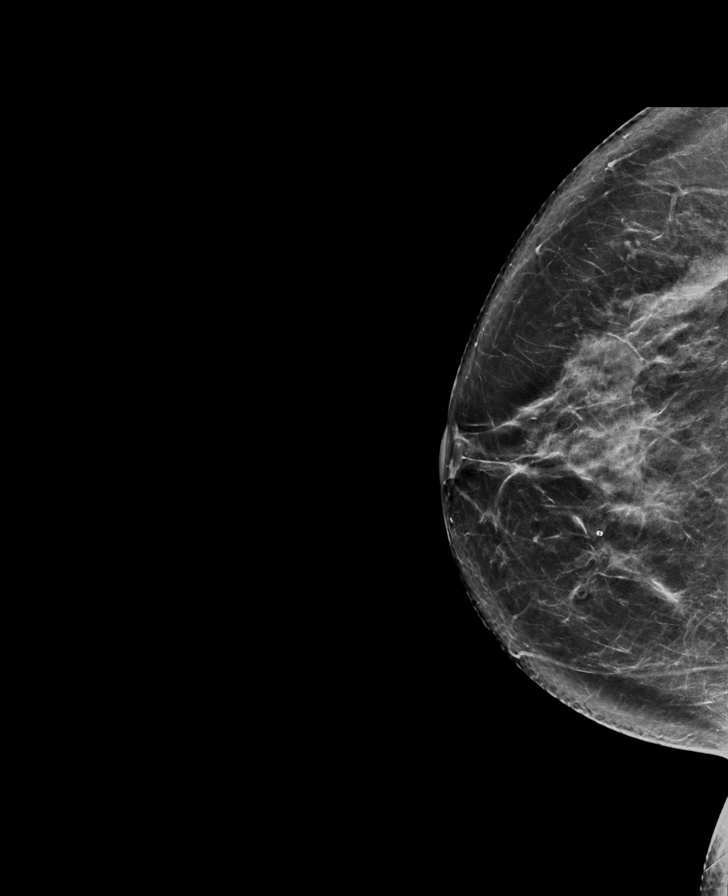

[L CC synth-2D]
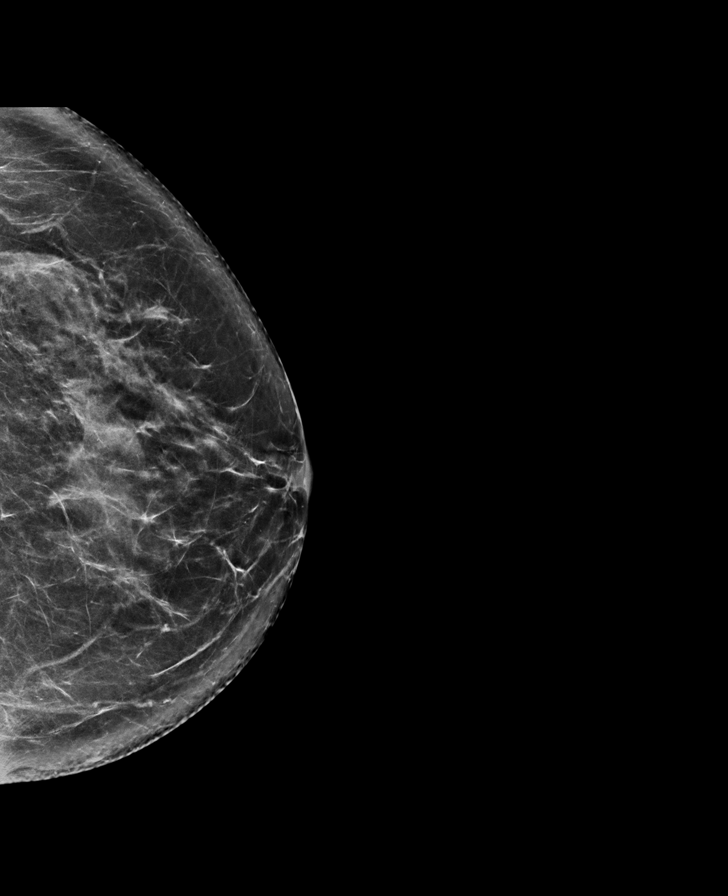

[R MLO synth-2D]
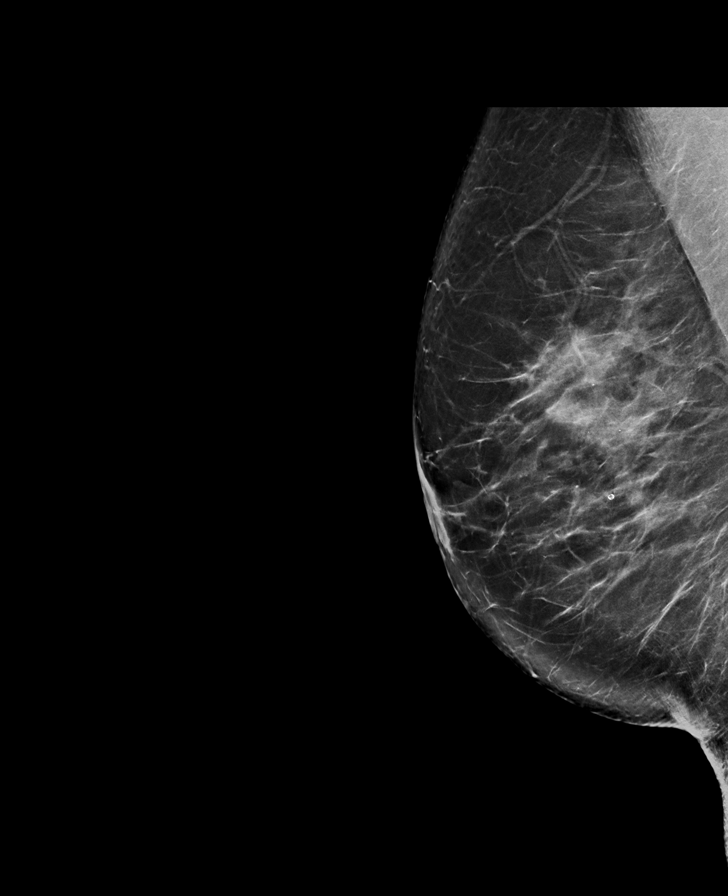

[R CC]
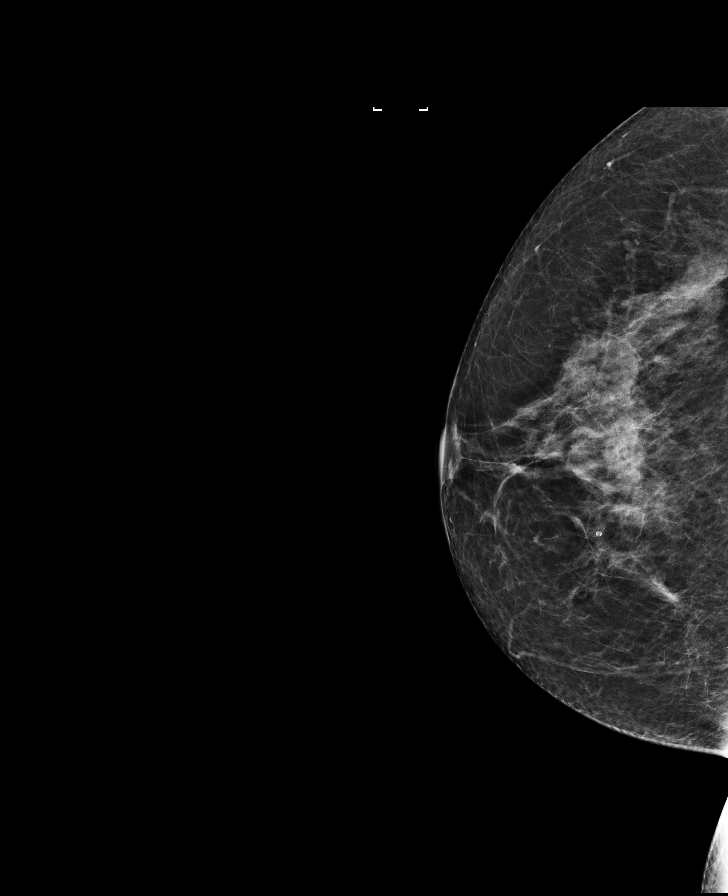

[L MLO]
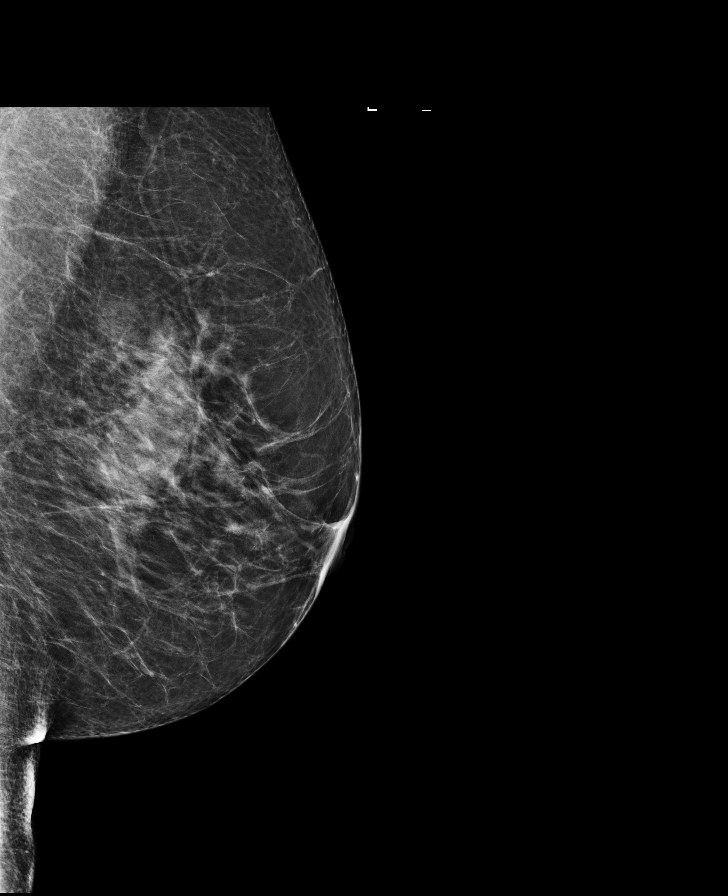

[8 of 28 positions shown; findings below may reference images not displayed]

ACR Breast Density Category c: The breast tissue is heterogeneously
dense, which may obscure small masses.
FINDINGS: There are no findings suspicious for malignancy. Images were
processed with CAD.
IMPRESSION: No mammographic evidence of malignancy. A result letter of this
screening mammogram will be mailed directly to the patient.

RECOMMENDATION:
Screening mammogram in one year. (Code:[id number])

BI-RADS CATEGORY  1: Negative.

## 2019-07-13 ENCOUNTER — Other Ambulatory Visit: Payer: Self-pay | Admitting: Family

## 2019-08-31 ENCOUNTER — Ambulatory Visit: Payer: Self-pay | Admitting: Physician Assistant

## 2019-09-08 ENCOUNTER — Ambulatory Visit: Payer: Self-pay | Admitting: Physician Assistant

## 2019-09-08 ENCOUNTER — Other Ambulatory Visit: Payer: Self-pay

## 2019-09-08 ENCOUNTER — Encounter: Payer: Self-pay | Admitting: Physician Assistant

## 2019-09-08 VITALS — BP 124/83 | HR 84 | Temp 97.1°F | Resp 16 | Ht 64.0 in | Wt 139.0 lb

## 2019-09-08 DIAGNOSIS — S83249A Other tear of medial meniscus, current injury, unspecified knee, initial encounter: Secondary | ICD-10-CM

## 2019-09-08 DIAGNOSIS — M1711 Unilateral primary osteoarthritis, right knee: Secondary | ICD-10-CM

## 2019-09-08 DIAGNOSIS — R29898 Other symptoms and signs involving the musculoskeletal system: Secondary | ICD-10-CM | POA: Insufficient documentation

## 2019-09-08 DIAGNOSIS — M25669 Stiffness of unspecified knee, not elsewhere classified: Secondary | ICD-10-CM | POA: Insufficient documentation

## 2019-09-08 DIAGNOSIS — K1370 Unspecified lesions of oral mucosa: Secondary | ICD-10-CM

## 2019-09-08 DIAGNOSIS — R269 Unspecified abnormalities of gait and mobility: Secondary | ICD-10-CM | POA: Insufficient documentation

## 2019-09-08 DIAGNOSIS — M6530 Trigger finger, unspecified finger: Secondary | ICD-10-CM | POA: Insufficient documentation

## 2019-09-08 DIAGNOSIS — M6780 Other specified disorders of synovium and tendon, unspecified site: Secondary | ICD-10-CM

## 2019-09-08 DIAGNOSIS — Z1231 Encounter for screening mammogram for malignant neoplasm of breast: Secondary | ICD-10-CM

## 2019-09-08 HISTORY — DX: Other specified disorders of synovium and tendon, unspecified site: M67.80

## 2019-09-08 HISTORY — DX: Trigger finger, unspecified finger: M65.30

## 2019-09-08 HISTORY — DX: Other tear of medial meniscus, current injury, unspecified knee, initial encounter: S83.249A

## 2019-09-08 MED ORDER — MELOXICAM 7.5 MG PO TABS: 7.5000 mg | ORAL_TABLET | Freq: Every day | ORAL | 0 refills | Status: DC

## 2019-09-08 NOTE — Progress Notes (Signed)
Patient: Ellen Montoya Female    DOB: 1956-01-17   63 y.o.   MRN: [id number] Visit Date: 09/08/2019  Today's Provider: Trinna Post, PA-C   Chief Complaint  Patient presents with  . New Patient visit   Subjective:     The patient is a 63 Y.O female that is being seen to esablish care.  Patient living in [place] with husband. She has 3 children and 2 grandchildren. She is retired.   She had a colonoscopy 8 years ago. PAP smear up to date.   She does have two lesions in her mouth which dentist recommended her to see a dermatologist. One has been there for many months on her lip and one has emerged on the inside of her mouth in the past several weeks.   She takes meloxicam sparingly for arthritis of her knee which she has had replaced in the past.   Allergies  Allergen Reactions  . Pollen Extract-Tree Extract [Pollen Extract]      Current Outpatient Medications:  .  meloxicam (MOBIC) 7.5 MG tablet, Take 1 tablet (7.5 mg total) by mouth daily., Disp: 90 tablet, Rfl: 0  Review of Systems  Constitutional: Negative.   HENT: Negative.   Eyes: Negative.   Respiratory: Negative.   Cardiovascular: Negative.   Gastrointestinal: Negative.   Endocrine: Negative.   Genitourinary: Negative.   Musculoskeletal: Positive for arthralgias.  Skin: Negative.   Allergic/Immunologic: Positive for environmental allergies.  Neurological: Negative.   Hematological: Negative.   Psychiatric/Behavioral: Negative.     Social History   Tobacco Use  . Smoking status: Never Smoker  . Smokeless tobacco: Never Used  Substance Use Topics  . Alcohol use: No      Objective:   BP (!) 144/84   Pulse 77   Temp (!) 97.1 F (36.2 C) (Temporal)   Resp 16   Ht 5\' 4"  (1.626 m)   Wt 139 lb (63 kg)   BMI 23.86 kg/m  Vitals:   09/08/19 1418  BP: (!) 144/84  Pulse: 77  Resp: 16  Temp: (!) 97.1 F (36.2 C)  TempSrc: Temporal  Weight: 139 lb (63 kg)  Height: 5\' 4"  (1.626 m)    Body mass index is 23.86 kg/m.   Physical Exam Constitutional:      Appearance: Normal appearance.  HENT:     Mouth/Throat:   Cardiovascular:     Rate and Rhythm: Normal rate and regular rhythm.     Heart sounds: Normal heart sounds.  Pulmonary:     Effort: Pulmonary effort is normal.     Breath sounds: Normal breath sounds.  Skin:    General: Skin is warm and dry.  Neurological:     Mental Status: She is alert and oriented to person, place, and time. Mental status is at baseline.  Psychiatric:        Mood and Affect: Mood normal.        Behavior: Behavior normal.      No results found for any visits on 09/08/19.     Assessment & Plan    1. Encounter for screening mammogram for malignant neoplasm of breast   2. Mouth lesion  Would recommend she see a dermatologist as well.  3. Osteoarthritis of right knee, unspecified osteoarthritis type  - meloxicam (MOBIC) 7.5 MG tablet; Take 1 tablet (7.5 mg total) by mouth daily.  Dispense: 90 tablet; Refill: 0  F/u next year for CPE and welcome to medicare.  The entirety of the information documented in the History of Present Illness, Review of Systems and Physical Exam were personally obtained by me. Portions of this information were initially documented by [facility], CMA and reviewed by me for thoroughness and accuracy.         Trinna Post, PA-C  [facility],

## 2019-09-13 NOTE — Patient Instructions (Signed)
Arthritis Arthritis means joint pain. It can also mean joint disease. A joint is a place where bones come together. There are more than 100 types of arthritis. What are the causes? This condition may be caused by:  Wear and tear of a joint. This is the most common cause.  A lot of acid in the blood, which leads to pain in the joint (gout).  Pain and swelling (inflammation) in a joint.  Infection of a joint.  Injuries in the joint.  A reaction to medicines (allergy). In some cases, the cause may not be known. What are the signs or symptoms? Symptoms of this condition include:  Redness at a joint.  Swelling at a joint.  Stiffness at a joint.  Warmth coming from the joint.  A fever.  A feeling of being sick. How is this treated? This condition may be treated with:  Treating the cause, if it is known.  Rest.  Raising (elevating) the joint.  Putting cold or hot packs on the joint.  Medicines to treat symptoms and reduce pain and swelling.  Shots of medicines (cortisone) into the joint. You may also be told to make changes in your life, such as doing exercises and losing weight. Follow these instructions at home: Medicines  Take over-the-counter and prescription medicines only as told by your doctor.  Do not take aspirin for pain if your doctor says that you may have gout. Activity  Rest your joint if your doctor tells you to.  Avoid activities that make the pain worse.  Exercise your joint regularly as told by your doctor. Try doing exercises like: ? Swimming. ? Water aerobics. ? Biking. ? Walking. Managing pain, stiffness, and swelling      If told, put ice on the affected area. ? Put ice in a plastic bag. ? Place a towel between your skin and the bag. ? Leave the ice on for 20 minutes, 2-3 times per day.  If your joint is swollen, raise (elevate) it above the level of your heart if told by your doctor.  If your joint feels stiff in the morning,  try taking a warm shower.  If told, put heat on the affected area. Do this as often as told by your doctor. Use the heat source that your doctor recommends, such as a moist heat pack or a heating pad. If you have diabetes, do not apply heat without asking your doctor. To apply heat: ? Place a towel between your skin and the heat source. ? Leave the heat on for 20-30 minutes. ? Remove the heat if your skin turns bright red. This is very important if you are unable to feel pain, heat, or cold. You may have a greater risk of getting burned. General instructions  Do not use any products that contain nicotine or tobacco, such as cigarettes, e-cigarettes, and chewing tobacco. If you need help quitting, ask your doctor.  Keep all follow-up visits as told by your doctor. This is important. Contact a doctor if:  The pain gets worse.  You have a fever. Get help right away if:  You have very bad pain in your joint.  You have swelling in your joint.  Your joint is red.  Many joints become painful and swollen.  You have very bad back pain.  Your leg is very weak.  You cannot control your pee (urine) or poop (stool). Summary  Arthritis means joint pain. It can also mean joint disease. A joint is a place   where bones come together.  The most common cause of this condition is wear and tear of a joint.  Symptoms of this condition include redness, swelling, or stiffness of the joint.  This condition is treated with rest, raising the joint, medicines, and putting cold or hot packs on the joint.  Follow your doctor's instructions about medicines, activity, exercises, and other home care treatments. This information is not intended to replace advice given to you by your health care provider. Make sure you discuss any questions you have with your health care provider. Document Released: 12/02/2009 Document Revised: 08/15/2018 Document Reviewed: 08/15/2018 Elsevier Patient Education  2020  [employer]  

## 2019-09-25 DIAGNOSIS — R69 Illness, unspecified: Secondary | ICD-10-CM | POA: Diagnosis not present

## 2020-04-01 DIAGNOSIS — R69 Illness, unspecified: Secondary | ICD-10-CM | POA: Diagnosis not present

## 2020-05-13 ENCOUNTER — Ambulatory Visit: Payer: Self-pay | Admitting: Physician Assistant

## 2020-05-20 NOTE — Progress Notes (Signed)
Complete physical exam   Patient: Ellen Montoya   DOB: August 06, 1956   64 y.o. Female  MRN: [id number] Visit Date: 05/21/2020  Today's healthcare provider: Trinna Post, PA-C   Chief Complaint  Patient presents with  . Annual Exam   Subjective    Ellen Montoya is a 64 y.o. female who presents today for a complete physical exam.  She reports consuming a general diet. The patient does not participate in regular exercise at present. She generally feels well. She reports sleeping well. She does not have additional problems to discuss today.   Colon Cancer screening - [facility] 11/2010, normal   Mammogram - last 2018  Pneumonia - due for first shot  Flu shot - no high dose today, pt will get it at pharmacy   Shingles shot - will call insurance  Dexa: Due    Past Medical History:  Diagnosis Date  . Allergy   . Arthritis   . GERD (gastroesophageal reflux disease)    Past Surgical History:  Procedure Laterality Date  . JOINT REPLACEMENT    . laprascopy     Social History   Socioeconomic History  . Marital status: Married    Spouse name: Not on file  . Number of children: Not on file  . Years of education: Not on file  . Highest education level: Not on file  Occupational History  . Not on file  Tobacco Use  . Smoking status: Never Smoker  . Smokeless tobacco: Never Used  Substance and Sexual Activity  . Alcohol use: No  . Drug use: No  . Sexual activity: Not on file  Other Topics Concern  . Not on file  Social History Narrative   From [place]      Married      3 -children      Work at [employer] Strain:   . Difficulty of Paying Living Expenses: Not on file  Food Insecurity:   . Worried About Charity fundraiser in the Last Year: Not on file  . Ran Out of Food in the Last Year: Not on file  Transportation Needs:   . Lack of Transportation (Medical): Not  on file  . Lack of Transportation (Non-Medical): Not on file  Physical Activity:   . Days of Exercise per Week: Not on file  . Minutes of Exercise per Session: Not on file  Stress:   . Feeling of Stress : Not on file  Social Connections:   . Frequency of Communication with Friends and Family: Not on file  . Frequency of Social Gatherings with Friends and Family: Not on file  . Attends Religious Services: Not on file  . Active Member of Clubs or Organizations: Not on file  . Attends Archivist Meetings: Not on file  . Marital Status: Not on file  Intimate Partner Violence:   . Fear of Current or Ex-Partner: Not on file  . Emotionally Abused: Not on file  . Physically Abused: Not on file  . Sexually Abused: Not on file   Family Status  Relation Name Status  . Father  Alive  . MGM  (Not Specified)  . Mother  Deceased  . Neg Hx  (Not Specified)   Family History  Problem Relation Age of Onset  . Heart disease Father        thinks a MI  .  Heart disease Maternal Grandmother   . Leukemia Mother   . Ovarian cancer Neg Hx   . Colon cancer Neg Hx    Allergies  Allergen Reactions  . Pollen Extract-Tree Extract [Pollen Extract]     Patient Care Team: Paulene Floor as PCP - General (Physician Assistant)   Medications: Outpatient Medications Prior to Visit  Medication Sig  . meloxicam (MOBIC) 7.5 MG tablet Take 1 tablet (7.5 mg total) by mouth daily.   No facility-administered medications prior to visit.    Review of Systems  Constitutional: Negative.   HENT: Negative.   Eyes: Negative.   Respiratory: Negative.   Cardiovascular: Negative.   Gastrointestinal: Negative.   Endocrine: Negative.   Genitourinary: Negative.   Musculoskeletal: Negative.   Skin: Negative.   Allergic/Immunologic: Negative.   Neurological: Negative.   Hematological: Negative.   Psychiatric/Behavioral: Negative.      {Heme  Chem  Endocrine  Serology  Results Review  (optional):23779::" "}  Objective    BP 125/78 (BP Location: Right Arm)   Pulse 82   Temp 98.8 F (37.1 C)   Ht 5\' 4"  (1.626 m)   Wt 139 lb 12.8 oz (63.4 kg)   BMI 24.00 kg/m    Physical Exam Constitutional:      Appearance: Normal appearance.  HENT:     Right Ear: Tympanic membrane normal.     Left Ear: Tympanic membrane normal.  Cardiovascular:     Rate and Rhythm: Normal rate and regular rhythm.     Pulses: Normal pulses.     Heart sounds: Normal heart sounds.  Pulmonary:     Effort: Pulmonary effort is normal.     Breath sounds: Normal breath sounds.  Skin:    General: Skin is warm and dry.  Neurological:     Mental Status: She is alert and oriented to person, place, and time. Mental status is at baseline.  Psychiatric:        Mood and Affect: Mood normal.        Behavior: Behavior normal.      Last depression screening scores PHQ 2/9 Scores 09/08/2019 01/21/2017  PHQ - 2 Score 0 0  PHQ- 9 Score 0 -   Last fall risk screening Fall Risk  09/08/2019  Falls in the past year? 1  Number falls in past yr: 1  Injury with Fall? 0  Follow up Falls evaluation completed   Last Audit-C alcohol use screening Alcohol Use Disorder Test (AUDIT) 09/08/2019  1. How often do you have a drink containing alcohol? 0  2. How many drinks containing alcohol do you have on a typical day when you are drinking? 0  3. How often do you have six or more drinks on one occasion? 0  AUDIT-C Score 0   A score of 3 or more in women, and 4 or more in men indicates increased risk for alcohol abuse, EXCEPT if all of the points are from question 1   No results found for any visits on 05/21/20.  Assessment & Plan    Routine Health Maintenance and Physical Exam  Exercise Activities and Dietary recommendations Goals   None     Immunization History  Administered Date(s) Administered  . Influenza,inj,Quad PF,6+ Mos 12/17/2016, 10/16/2018  . Influenza-Unspecified 06/19/2017  .  Pneumococcal Conjugate-13 05/21/2020    Health Maintenance  Topic Date Due  . Hepatitis C Screening  Never done  . COVID-19 Vaccine (1) Never done  . TETANUS/TDAP  Never done  .  COLONOSCOPY  Never done  . MAMMOGRAM  01/19/2019  . DEXA SCAN  Never done  . INFLUENZA VACCINE  04/21/2020  . PNA vac Low Risk Adult (2 of 2 - PPSV23) 05/21/2021    Discussed health benefits of physical activity, and encouraged her to engage in regular exercise appropriate for her age and condition.   1. Annual physical exam  Encouraged patient to schedule welcome to medicare visit.   - HIV Antibody (routine testing w rflx) - TSH - Lipid panel - Comprehensive metabolic panel - CBC with Differential/Platelet - Hepatitis C Antibody  2. Encounter for screening mammogram for malignant neoplasm of breast  - MM Digital Screening; Future  3. Need for vaccination against Streptococcus pneumoniae  - Pneumococcal conjugate vaccine 13-valent  4. Postmenopausal  - DG Bone Density; Future  5. Colon cancer screening  Will request records from [facility] though it seems like she would be due next year.       ITrinna Post, PA-C, have reviewed all documentation for this visit. The documentation on 05/21/20 for the exam, diagnosis, procedures, and orders are all accurate and complete.    Paulene Floor  [facility] [phone] (phone) [phone] (fax)  [place]

## 2020-05-21 ENCOUNTER — Encounter: Payer: Self-pay | Admitting: Physician Assistant

## 2020-05-21 ENCOUNTER — Ambulatory Visit (INDEPENDENT_AMBULATORY_CARE_PROVIDER_SITE_OTHER): Payer: Medicare HMO | Admitting: Physician Assistant

## 2020-05-21 ENCOUNTER — Other Ambulatory Visit: Payer: Self-pay

## 2020-05-21 VITALS — BP 125/78 | HR 82 | Temp 98.8°F | Ht 64.0 in | Wt 139.8 lb

## 2020-05-21 DIAGNOSIS — Z1231 Encounter for screening mammogram for malignant neoplasm of breast: Secondary | ICD-10-CM

## 2020-05-21 DIAGNOSIS — Z780 Asymptomatic menopausal state: Secondary | ICD-10-CM | POA: Diagnosis not present

## 2020-05-21 DIAGNOSIS — Z0000 Encounter for general adult medical examination without abnormal findings: Secondary | ICD-10-CM | POA: Diagnosis not present

## 2020-05-21 DIAGNOSIS — Z23 Encounter for immunization: Secondary | ICD-10-CM

## 2020-05-21 DIAGNOSIS — Z1211 Encounter for screening for malignant neoplasm of colon: Secondary | ICD-10-CM

## 2020-05-21 NOTE — Patient Instructions (Signed)
Physical Exam  Shingrix - shingles vaccine - call insurance company to see where it's paid for

## 2020-05-22 LAB — CBC WITH DIFFERENTIAL/PLATELET
Basophils Absolute: 0.1 10*3/uL (ref 0.0–0.2)
Basos: 1 %
EOS (ABSOLUTE): 0.1 10*3/uL (ref 0.0–0.4)
Eos: 3 %
Hematocrit: 38.5 % (ref 34.0–46.6)
Hemoglobin: 13.3 g/dL (ref 11.1–15.9)
Immature Grans (Abs): 0 10*3/uL (ref 0.0–0.1)
Immature Granulocytes: 0 %
Lymphocytes Absolute: 1.6 10*3/uL (ref 0.7–3.1)
Lymphs: 31 %
MCH: 30.8 pg (ref 26.6–33.0)
MCHC: 34.5 g/dL (ref 31.5–35.7)
MCV: 89 fL (ref 79–97)
Monocytes Absolute: 0.5 10*3/uL (ref 0.1–0.9)
Monocytes: 9 %
Neutrophils Absolute: 3 10*3/uL (ref 1.4–7.0)
Neutrophils: 56 %
Platelets: 255 10*3/uL (ref 150–450)
RBC: 4.32 x10E6/uL (ref 3.77–5.28)
RDW: 12.9 % (ref 11.7–15.4)
WBC: 5.3 10*3/uL (ref 3.4–10.8)

## 2020-05-22 LAB — COMPREHENSIVE METABOLIC PANEL
ALT: 14 IU/L (ref 0–32)
AST: 16 IU/L (ref 0–40)
Albumin/Globulin Ratio: 2.1 (ref 1.2–2.2)
Albumin: 4.7 g/dL (ref 3.8–4.8)
Alkaline Phosphatase: 86 IU/L (ref 48–121)
BUN/Creatinine Ratio: 25 (ref 12–28)
BUN: 15 mg/dL (ref 8–27)
Bilirubin Total: 0.9 mg/dL (ref 0.0–1.2)
CO2: 23 mmol/L (ref 20–29)
Calcium: 9.6 mg/dL (ref 8.7–10.3)
Chloride: 104 mmol/L (ref 96–106)
Creatinine, Ser: 0.61 mg/dL (ref 0.57–1.00)
GFR calc Af Amer: 109 mL/min/{1.73_m2} (ref >59)
GFR calc non Af Amer: 95 mL/min/{1.73_m2} (ref >59)
Globulin, Total: 2.2 g/dL (ref 1.5–4.5)
Glucose: 91 mg/dL (ref 65–99)
Potassium: 4.4 mmol/L (ref 3.5–5.2)
Sodium: 140 mmol/L (ref 134–144)
Total Protein: 6.9 g/dL (ref 6.0–8.5)

## 2020-05-22 LAB — LIPID PANEL
Chol/HDL Ratio: 3.8 ratio (ref 0.0–4.4)
Cholesterol, Total: 204 mg/dL — ABNORMAL HIGH (ref 100–199)
HDL: 53 mg/dL (ref >39)
LDL Chol Calc (NIH): 126 mg/dL — ABNORMAL HIGH (ref 0–99)
Triglycerides: 143 mg/dL (ref 0–149)
VLDL Cholesterol Cal: 25 mg/dL (ref 5–40)

## 2020-05-22 LAB — HEPATITIS C ANTIBODY: Hep C Virus Ab: <0.1 s/co ratio (ref 0.0–0.9)

## 2020-05-22 LAB — TSH: TSH: 1.85 u[IU]/mL (ref 0.450–4.500)

## 2020-05-22 LAB — HIV ANTIBODY (ROUTINE TESTING W REFLEX): HIV Screen 4th Generation wRfx: NONREACTIVE

## 2020-05-29 ENCOUNTER — Telehealth: Payer: Self-pay | Admitting: Physician Assistant

## 2020-05-29 NOTE — Telephone Encounter (Signed)
I haven't heard anything from [facility] about her colonoscopy. Can we request again please?

## 2020-05-30 NOTE — Telephone Encounter (Signed)
Sent fax request to [phone].

## 2020-06-14 ENCOUNTER — Telehealth (INDEPENDENT_AMBULATORY_CARE_PROVIDER_SITE_OTHER): Payer: Medicare HMO | Admitting: Physician Assistant

## 2020-06-14 DIAGNOSIS — R062 Wheezing: Secondary | ICD-10-CM

## 2020-06-14 DIAGNOSIS — R509 Fever, unspecified: Secondary | ICD-10-CM

## 2020-06-14 DIAGNOSIS — J40 Bronchitis, not specified as acute or chronic: Secondary | ICD-10-CM | POA: Diagnosis not present

## 2020-06-14 DIAGNOSIS — R059 Cough, unspecified: Secondary | ICD-10-CM

## 2020-06-14 DIAGNOSIS — R05 Cough: Secondary | ICD-10-CM | POA: Diagnosis not present

## 2020-06-14 MED ORDER — PREDNISONE 20 MG PO TABS: 20.0000 mg | ORAL_TABLET | Freq: Every day | ORAL | 0 refills | Status: DC

## 2020-06-14 MED ORDER — ALBUTEROL SULFATE HFA 108 (90 BASE) MCG/ACT IN AERS: 2.0000 | INHALATION_SPRAY | Freq: Four times a day (QID) | RESPIRATORY_TRACT | 2 refills | Status: DC | PRN

## 2020-06-14 NOTE — Progress Notes (Signed)
MyChart Video Visit    Virtual Visit via Video Note   This visit type was conducted due to national recommendations for restrictions regarding the COVID-19 Pandemic (e.g. social distancing) in an effort to limit this patient's exposure and mitigate transmission in our community. This patient is at least at moderate risk for complications without adequate follow up. This format is felt to be most appropriate for this patient at this time. Physical exam was limited by quality of the video and audio technology used for the visit.   Patient location: Home Provider location: Office   I discussed the limitations of evaluation and management by telemedicine and the availability of in person appointments. The patient expressed understanding and agreed to proceed.  Patient: Ellen Montoya   DOB: Mar 08, 1956   64 y.o. Female  MRN: [id number] Visit Date: 06/14/2020  Today's healthcare provider: Trinna Post, PA-C   Chief Complaint  Patient presents with  . Cough   Subjective    Cough Associated symptoms include a fever and headaches.     Patient report symptoms starting Tuesday 06/11/2020 and she was tested on Wednesday 06/12/2020. She is experiencing cough, fever, headaches. Denies SOB, nausea, vomiting, chills.     Medications: Outpatient Medications Prior to Visit  Medication Sig  . meloxicam (MOBIC) 7.5 MG tablet Take 1 tablet (7.5 mg total) by mouth daily.   No facility-administered medications prior to visit.    Review of Systems  Constitutional: Positive for fever.  Respiratory: Positive for cough.   Neurological: Positive for headaches.      Objective    There were no vitals taken for this visit.   Physical Exam Constitutional:      Appearance: Normal appearance.  Pulmonary:     Effort: Pulmonary effort is normal. No respiratory distress.  Neurological:     Mental Status: She is alert.  Psychiatric:        Mood and Affect: Mood normal.        Behavior:  Behavior normal.        Assessment & Plan     1. Fever, unspecified fever cause  Suggest she get retested for COVID as she may have tested to early. Counseled about infusion if she is positive.  2. Cough   3. Wheezing  - predniSONE (DELTASONE) 20 MG tablet; Take 1 tablet (20 mg total) by mouth daily with breakfast.  Dispense: 5 tablet; Refill: 0 - albuterol (VENTOLIN HFA) 108 (90 Base) MCG/ACT inhaler; Inhale 2 puffs into the lungs every 6 (six) hours as needed for wheezing or shortness of breath.  Dispense: 1 each; Refill: 2  4. Bronchitis  Treat with prednisone and albuterol.   No follow-ups on file.     I discussed the assessment and treatment plan with the patient. The patient was provided an opportunity to ask questions and all were answered. The patient agreed with the plan and demonstrated an understanding of the instructions.   The patient was advised to call back or seek an in-person evaluation if the symptoms worsen or if the condition fails to improve as anticipated.   ITrinna Post, PA-C, have reviewed all documentation for this visit. The documentation on 06/19/20 for the exam, diagnosis, procedures, and orders are all accurate and complete.  The entirety of the information documented in the History of Present Illness, Review of Systems and Physical Exam were personally obtained by me. Portions of this information were initially documented by [facility] and reviewed by me  for thoroughness and accuracy.    Paulene Floor [facility] [phone] (phone) [phone] (fax)  [place]

## 2020-06-17 ENCOUNTER — Other Ambulatory Visit: Payer: Self-pay | Admitting: Physician Assistant

## 2020-06-17 ENCOUNTER — Telehealth: Payer: Self-pay

## 2020-06-17 DIAGNOSIS — U071 COVID-19: Secondary | ICD-10-CM

## 2020-06-17 NOTE — Telephone Encounter (Signed)
Referral placed.

## 2020-06-17 NOTE — Telephone Encounter (Signed)
Copied from [place] [id number]. Topic: General - Other >> Jun 17, 2020  8:43 AM Celene Kras wrote: Reason for CRM: Pt called stating that she tested positive for covid. She states that she would like to move forward with the infusion as mentioned by PCP. Please advise.

## 2020-06-17 NOTE — Progress Notes (Signed)
I connected by phone with Ellen Montoya on 06/17/2020 at 7:07 PM to discuss the potential use of a new treatment for mild to moderate COVID-19 viral infection in non-hospitalized patients.  This patient is a 64 y.o. female that meets the FDA criteria for Emergency Use Authorization of COVID monoclonal antibody casirivimab/imdevimab or bamlanivimab/eteseviamb.  Has a (+) direct SARS-CoV-2 viral test result  Has mild or moderate COVID-19   Is NOT hospitalized due to COVID-19  Is within 10 days of symptom onset  Has at least one of the high risk factor(s) for progression to severe COVID-19 and/or hospitalization as defined in EUA.  Specific high risk criteria : Older age (>/= 66 yo)   I have spoken and communicated the following to the patient or parent/caregiver regarding COVID monoclonal antibody treatment:  1. FDA has authorized the emergency use for the treatment of mild to moderate COVID-19 in adults and pediatric patients with positive results of direct SARS-CoV-2 viral testing who are 30 years of age and older weighing at least 40 kg, and who are at high risk for progressing to severe COVID-19 and/or hospitalization.  2. The significant known and potential risks and benefits of COVID monoclonal antibody, and the extent to which such potential risks and benefits are unknown.  3. Information on available alternative treatments and the risks and benefits of those alternatives, including clinical trials.  4. Patients treated with COVID monoclonal antibody should continue to self-isolate and use infection control measures (e.g., wear mask, isolate, social distance, avoid sharing personal items, clean and disinfect "high touch" surfaces, and frequent handwashing) according to CDC guidelines.   5. The patient or parent/caregiver has the option to accept or refuse COVID monoclonal antibody treatment.  After reviewing this information with the patient, the patient has agreed to receive one of  the available covid 19 monoclonal antibodies and will be provided an appropriate fact sheet prior to infusion.   [place], [place] 06/17/2020 7:07 PM

## 2020-06-17 NOTE — Telephone Encounter (Signed)
Patient was advised and also advised someone will contact her for appointment.

## 2020-06-18 ENCOUNTER — Ambulatory Visit (HOSPITAL_COMMUNITY)
Admission: RE | Admit: 2020-06-18 | Discharge: 2020-06-18 | Disposition: A | Discharge status: Home / Self Care | Payer: Medicare Other | Source: Ambulatory Visit | Attending: Pulmonary Disease | Admitting: Pulmonary Disease

## 2020-06-18 DIAGNOSIS — U071 COVID-19: Secondary | ICD-10-CM | POA: Diagnosis present

## 2020-06-18 DIAGNOSIS — Z23 Encounter for immunization: Secondary | ICD-10-CM | POA: Diagnosis not present

## 2020-06-18 MED ORDER — ALBUTEROL SULFATE HFA 108 (90 BASE) MCG/ACT IN AERS: 2.0000 | INHALATION_SPRAY | Freq: Once | RESPIRATORY_TRACT | Status: DC | PRN

## 2020-06-18 MED ORDER — FAMOTIDINE IN NACL 20-0.9 MG/50ML-% IV SOLN: 20.0000 mg | Freq: Once | INTRAVENOUS | Status: DC | PRN

## 2020-06-18 MED ORDER — EPINEPHRINE 0.3 MG/0.3ML IJ SOAJ: 0.3000 mg | Freq: Once | INTRAMUSCULAR | Status: DC | PRN

## 2020-06-18 MED ORDER — DIPHENHYDRAMINE HCL 50 MG/ML IJ SOLN: 50.0000 mg | Freq: Once | INTRAMUSCULAR | Status: DC | PRN

## 2020-06-18 MED ORDER — SODIUM CHLORIDE 0.9 % IV SOLN
1200.0000 mg | Freq: Once | INTRAVENOUS | Status: AC
  Administered 2020-06-18: 1200 mg via INTRAVENOUS

## 2020-06-18 MED ORDER — METHYLPREDNISOLONE SODIUM SUCC 125 MG IJ SOLR: 125.0000 mg | Freq: Once | INTRAMUSCULAR | Status: DC | PRN

## 2020-06-18 MED ORDER — SODIUM CHLORIDE 0.9 % IV SOLN: INTRAVENOUS | Status: DC | PRN

## 2020-06-18 NOTE — Discharge Instructions (Signed)

## 2020-06-18 NOTE — Progress Notes (Signed)
  Diagnosis: COVID-19  Physician: Dr. Joya Gaskins  Procedure: Covid Infusion Clinic Med: casirivimab\imdevimab infusion - Provided patient with casirivimab\imdevimab fact sheet for patients, parents and caregivers prior to infusion.  Complications: No immediate complications noted.  Discharge: Discharged home   [place] 06/18/2020

## 2020-07-30 ENCOUNTER — Ambulatory Visit
Admission: RE | Admit: 2020-07-30 | Discharge: 2020-07-30 | Disposition: A | Discharge status: Home / Self Care | Payer: Medicare HMO | Source: Ambulatory Visit | Attending: Physician Assistant | Admitting: Physician Assistant

## 2020-07-30 ENCOUNTER — Other Ambulatory Visit: Payer: Self-pay

## 2020-07-30 DIAGNOSIS — M85852 Other specified disorders of bone density and structure, left thigh: Secondary | ICD-10-CM | POA: Diagnosis not present

## 2020-07-30 DIAGNOSIS — Z780 Asymptomatic menopausal state: Secondary | ICD-10-CM | POA: Diagnosis not present

## 2020-09-05 DIAGNOSIS — R69 Illness, unspecified: Secondary | ICD-10-CM | POA: Diagnosis not present

## 2020-09-25 ENCOUNTER — Telehealth: Payer: Self-pay | Admitting: Physician Assistant

## 2020-09-25 NOTE — Telephone Encounter (Signed)
Patient called to reschedule her Medicare AWV.  Please call patient to schedule at [phone]

## 2020-09-26 ENCOUNTER — Telehealth: Payer: Self-pay

## 2020-09-26 ENCOUNTER — Other Ambulatory Visit: Payer: Self-pay

## 2020-09-26 NOTE — Telephone Encounter (Signed)
Made several attempts to contact pt to complete her telephonic AWV. Pt has not answered or returned my calls. Left a VM requesting a CB to r/s telephonic AWV to another day.

## 2020-09-26 NOTE — Telephone Encounter (Signed)
Copied from CRM [id number]. Topic: Appointment Scheduling - Scheduling Inquiry for Clinic >> Sep 26, 2020  1:14 PM Marylen Ponto wrote: Reason for CRM: Pt stated she received a voicemail from [facility] but when she called the number back it was an invalid#. Pt stated she will not be able to keep the appt so she wanted to call to make her aware so she would not waste her time calling.

## 2020-09-26 NOTE — Telephone Encounter (Signed)
LMTCB if needing to r/s AWV for today @ 3:20 PM. Direct # left to CB on. If not, advised pt I would call her @ 3:20 PM to complete telephonic AWV. # [phone]

## 2020-09-26 NOTE — Progress Notes (Signed)
This encounter was created in error - please disregard.

## 2020-12-23 ENCOUNTER — Other Ambulatory Visit: Payer: Self-pay | Admitting: Physician Assistant

## 2020-12-23 DIAGNOSIS — Z1231 Encounter for screening mammogram for malignant neoplasm of breast: Secondary | ICD-10-CM

## 2021-01-08 ENCOUNTER — Ambulatory Visit
Admission: RE | Admit: 2021-01-08 | Discharge: 2021-01-08 | Disposition: A | Discharge status: Home / Self Care | Payer: Medicare HMO | Source: Ambulatory Visit | Attending: Physician Assistant | Admitting: Physician Assistant

## 2021-01-08 ENCOUNTER — Other Ambulatory Visit: Payer: Self-pay

## 2021-01-08 DIAGNOSIS — Z1231 Encounter for screening mammogram for malignant neoplasm of breast: Secondary | ICD-10-CM

## 2021-01-29 DIAGNOSIS — Z0100 Encounter for examination of eyes and vision without abnormal findings: Secondary | ICD-10-CM | POA: Diagnosis not present

## 2021-01-29 DIAGNOSIS — H31001 Unspecified chorioretinal scars, right eye: Secondary | ICD-10-CM | POA: Diagnosis not present

## 2021-02-12 DIAGNOSIS — D225 Melanocytic nevi of trunk: Secondary | ICD-10-CM | POA: Diagnosis not present

## 2021-02-12 DIAGNOSIS — D2339 Other benign neoplasm of skin of other parts of face: Secondary | ICD-10-CM | POA: Diagnosis not present

## 2021-02-12 DIAGNOSIS — D485 Neoplasm of uncertain behavior of skin: Secondary | ICD-10-CM | POA: Diagnosis not present

## 2021-02-12 DIAGNOSIS — Z86018 Personal history of other benign neoplasm: Secondary | ICD-10-CM | POA: Diagnosis not present

## 2021-02-12 DIAGNOSIS — L249 Irritant contact dermatitis, unspecified cause: Secondary | ICD-10-CM | POA: Diagnosis not present

## 2021-02-12 DIAGNOSIS — L578 Other skin changes due to chronic exposure to nonionizing radiation: Secondary | ICD-10-CM | POA: Diagnosis not present

## 2021-02-12 DIAGNOSIS — L821 Other seborrheic keratosis: Secondary | ICD-10-CM | POA: Diagnosis not present

## 2021-05-22 ENCOUNTER — Encounter: Payer: Self-pay | Admitting: Physician Assistant

## 2021-05-22 ENCOUNTER — Other Ambulatory Visit: Payer: Self-pay

## 2021-05-22 ENCOUNTER — Ambulatory Visit (INDEPENDENT_AMBULATORY_CARE_PROVIDER_SITE_OTHER): Payer: Medicare HMO | Admitting: Family Medicine

## 2021-05-22 ENCOUNTER — Encounter: Payer: Self-pay | Admitting: Family Medicine

## 2021-05-22 VITALS — BP 121/76 | HR 84 | Temp 98.0°F | Resp 15 | Ht 63.0 in | Wt 131.8 lb

## 2021-05-22 DIAGNOSIS — K219 Gastro-esophageal reflux disease without esophagitis: Secondary | ICD-10-CM | POA: Diagnosis not present

## 2021-05-22 DIAGNOSIS — G8929 Other chronic pain: Secondary | ICD-10-CM | POA: Insufficient documentation

## 2021-05-22 DIAGNOSIS — Z0000 Encounter for general adult medical examination without abnormal findings: Secondary | ICD-10-CM | POA: Diagnosis not present

## 2021-05-22 DIAGNOSIS — R739 Hyperglycemia, unspecified: Secondary | ICD-10-CM | POA: Insufficient documentation

## 2021-05-22 DIAGNOSIS — M1711 Unilateral primary osteoarthritis, right knee: Secondary | ICD-10-CM

## 2021-05-22 DIAGNOSIS — Z1321 Encounter for screening for nutritional disorder: Secondary | ICD-10-CM | POA: Diagnosis not present

## 2021-05-22 DIAGNOSIS — J302 Other seasonal allergic rhinitis: Secondary | ICD-10-CM

## 2021-05-22 DIAGNOSIS — M25522 Pain in left elbow: Secondary | ICD-10-CM | POA: Diagnosis not present

## 2021-05-22 DIAGNOSIS — Z96659 Presence of unspecified artificial knee joint: Secondary | ICD-10-CM | POA: Diagnosis not present

## 2021-05-22 DIAGNOSIS — N3945 Continuous leakage: Secondary | ICD-10-CM | POA: Diagnosis not present

## 2021-05-22 DIAGNOSIS — Z23 Encounter for immunization: Secondary | ICD-10-CM | POA: Diagnosis not present

## 2021-05-22 DIAGNOSIS — Z1211 Encounter for screening for malignant neoplasm of colon: Secondary | ICD-10-CM | POA: Insufficient documentation

## 2021-05-22 DIAGNOSIS — M1990 Unspecified osteoarthritis, unspecified site: Secondary | ICD-10-CM | POA: Diagnosis not present

## 2021-05-22 DIAGNOSIS — E785 Hyperlipidemia, unspecified: Secondary | ICD-10-CM | POA: Diagnosis not present

## 2021-05-22 MED ORDER — MELOXICAM 7.5 MG PO TABS: 7.5000 mg | ORAL_TABLET | Freq: Every day | ORAL | 1 refills | Status: DC

## 2021-05-22 NOTE — Assessment & Plan Note (Signed)
L elbow pain following IV medication; unable to straighten arm; non-dominant arm; non-tender; minimal swelling; no warmth; no mechanism of injury Referral to sports med/ortho

## 2021-05-22 NOTE — Assessment & Plan Note (Signed)
2/2; completed today

## 2021-05-22 NOTE — Assessment & Plan Note (Signed)
>  10 yrs; completed today

## 2021-05-22 NOTE — Assessment & Plan Note (Signed)
Pulled for Vit D screening

## 2021-05-22 NOTE — Assessment & Plan Note (Signed)
Pain remains following arthroplasty Pt feels knee is weak and occ buckles Discussed PT referral for strength training exercises

## 2021-05-22 NOTE — Assessment & Plan Note (Signed)

## 2021-05-22 NOTE — Assessment & Plan Note (Signed)
Continue Mobic Rx for knee pain No additional NSAIDs; APAP/Tylenol OK- max of 3g/day Continue to work on strengthening muscles thru exercise

## 2021-05-22 NOTE — Assessment & Plan Note (Signed)
Occ use of OTC Prilosec Problem wax/wanes Has changed to decaf coffee- feels improvement since change Does not wish to take medication daily at this time

## 2021-05-22 NOTE — Progress Notes (Signed)
Complete physical exam   Patient: Ellen Montoya   DOB: [birth year]   65 y.o. Female  MRN: [id number] Visit Date: 05/22/2021  Today's healthcare provider: Gwyneth Sprout, FNP   Chief Complaint  Patient presents with   Annual Exam   Subjective     HPI  Ellen Montoya is a 65 y.o. female who presents today for a complete physical exam.  She reports consuming a general diet. Gym/ health club routine includes exercise classes. She generally feels well. She reports sleeping well. She does not have additional problems to discuss today.  Last Reported Pap- 01/21/17 Mammogram 01/08/21  Past Medical History:  Diagnosis Date   Allergy    Arthritis    GERD (gastroesophageal reflux disease)    Past Surgical History:  Procedure Laterality Date   JOINT REPLACEMENT     laprascopy     Social History   Socioeconomic History   Marital status: Married    Spouse name: Not on file   Number of children: Not on file   Years of education: Not on file   Highest education level: Not on file  Occupational History   Not on file  Tobacco Use   Smoking status: Never   Smokeless tobacco: Never  Substance and Sexual Activity   Alcohol use: No   Drug use: No   Sexual activity: Not on file  Other Topics Concern   Not on file  Social History Narrative   From [place]      Married      3 -children      Work at [employer] Strain: Not on file  Food Insecurity: Not on file  Transportation Needs: Not on file  Physical Activity: Not on file  Stress: Not on file  Social Connections: Not on file  Intimate Partner Violence: Not on file   Family Status  Relation Name Status   Father  Alive   MGM  (Not Specified)   Mother  Deceased   Neg Hx  (Not Specified)   Family History  Problem Relation Age of Onset   Heart disease Father        thinks a MI   Heart disease Maternal Grandmother    Leukemia Mother     Ovarian cancer Neg Hx    Colon cancer Neg Hx    Breast cancer Neg Hx    Allergies  Allergen Reactions   Pollen Extract-Tree Extract [Pollen Extract]     Patient Care Team: Gwyneth Sprout, FNP as PCP - General (Family Medicine)   Medications: Outpatient Medications Prior to Visit  Medication Sig   [DISCONTINUED] meloxicam (MOBIC) 7.5 MG tablet Take 1 tablet (7.5 mg total) by mouth daily.   [DISCONTINUED] albuterol (VENTOLIN HFA) 108 (90 Base) MCG/ACT inhaler Inhale 2 puffs into the lungs every 6 (six) hours as needed for wheezing or shortness of breath.   [DISCONTINUED] predniSONE (DELTASONE) 20 MG tablet Take 1 tablet (20 mg total) by mouth daily with breakfast.   No facility-administered medications prior to visit.    Review of Systems  Genitourinary:  Positive for enuresis.  Musculoskeletal:  Positive for arthralgias.  Allergic/Immunologic: Positive for environmental allergies.  All other systems reviewed and are negative.    Objective    BP 121/76   Pulse 84   Temp 98 F (36.7 C) (Oral)   Resp 15   Ht '5\' 3"'$  (1.6  m)   Wt 131 lb 12.8 oz (59.8 kg)   SpO2 99%   BMI 23.35 kg/m    Physical Exam Vitals and nursing note reviewed.  Constitutional:      General: She is not in acute distress.    Appearance: Normal appearance. She is normal weight. She is not ill-appearing, toxic-appearing or diaphoretic.  HENT:     Head: Normocephalic and atraumatic.     Right Ear: Tympanic membrane, ear canal and external ear normal. There is no impacted cerumen.     Left Ear: Tympanic membrane, ear canal and external ear normal. There is no impacted cerumen.     Nose: Nose normal. No congestion or rhinorrhea.     Mouth/Throat:     Mouth: Mucous membranes are moist.     Pharynx: Oropharynx is clear. No oropharyngeal exudate or posterior oropharyngeal erythema.  Eyes:     General:        Right eye: No discharge.        Left eye: No discharge.     Extraocular Movements:  Extraocular movements intact.     Conjunctiva/sclera: Conjunctivae normal.     Pupils: Pupils are equal, round, and reactive to light.  Neck:     Vascular: No carotid bruit.  Cardiovascular:     Rate and Rhythm: Normal rate and regular rhythm.     Pulses: Normal pulses.     Heart sounds: Normal heart sounds. No murmur heard.   No friction rub. No gallop.  Pulmonary:     Effort: Pulmonary effort is normal. No respiratory distress.     Breath sounds: Normal breath sounds. No stridor. No wheezing, rhonchi or rales.  Chest:     Chest wall: No tenderness.  Abdominal:     General: Abdomen is flat. Bowel sounds are normal. There is no distension.     Palpations: Abdomen is soft. There is no mass.     Tenderness: There is no abdominal tenderness. There is no guarding or rebound.     Hernia: No hernia is present.  Musculoskeletal:        General: Swelling present. No tenderness, deformity or signs of injury.       Arms:     Cervical back: Normal range of motion and neck supple. No rigidity or tenderness.     Right lower leg: No edema.     Left lower leg: No edema.     Comments: L elbow pain following IV medication; unable to straighten arm; non-dominant arm; non-tender; minimal swelling; no warmth; no mechanism of injury   Lymphadenopathy:     Cervical: No cervical adenopathy.  Skin:    General: Skin is warm and dry.     Capillary Refill: Capillary refill takes less than 2 seconds.     Coloration: Skin is not jaundiced or pale.     Findings: No bruising, erythema, lesion or rash.  Neurological:     General: No focal deficit present.     Mental Status: She is alert and oriented to person, place, and time. Mental status is at baseline.     Cranial Nerves: No cranial nerve deficit.     Sensory: No sensory deficit.     Motor: No weakness.     Coordination: Coordination normal.     Gait: Gait normal.     Deep Tendon Reflexes: Reflexes normal.  Psychiatric:        Mood and Affect: Mood  normal.        Behavior: Behavior  normal.        Thought Content: Thought content normal.        Judgment: Judgment normal.     Last depression screening scores PHQ 2/9 Scores 05/22/2021 09/08/2019 01/21/2017  PHQ - 2 Score 0 0 0  PHQ- 9 Score 0 0 -   Last fall risk screening Fall Risk  05/22/2021  Falls in the past year? 0  Number falls in past yr: 0  Injury with Fall? 0  Follow up -   Last Audit-C alcohol use screening Alcohol Use Disorder Test (AUDIT) 05/22/2021  1. How often do you have a drink containing alcohol? 0  2. How many drinks containing alcohol do you have on a typical day when you are drinking? 0  3. How often do you have six or more drinks on one occasion? 0  AUDIT-C Score 0   A score of 3 or more in women, and 4 or more in men indicates increased risk for alcohol abuse, EXCEPT if all of the points are from question 1   No results found for any visits on 05/22/21.  Assessment & Plan    Routine Health Maintenance and Physical Exam  Exercise Activities and Dietary recommendations  Goals   None     Immunization History  Administered Date(s) Administered   Influenza,inj,Quad PF,6+ Mos 12/17/2016, 10/16/2018   Influenza-Unspecified 06/19/2017   Pneumococcal Conjugate-13 05/21/2020    Health Maintenance  Topic Date Due   COVID-19 Vaccine (1) Never done   TETANUS/TDAP  Never done   COLONOSCOPY (Pts 45-29yr Insurance coverage will need to be confirmed)  Never done   Zoster Vaccines- Shingrix (1 of 2) Never done   INFLUENZA VACCINE  04/21/2021   PNA vac Low Risk Adult (2 of 2 - PPSV23) 05/21/2021   MAMMOGRAM  01/09/2023   DEXA SCAN  07/30/2025   Hepatitis C Screening  Completed   HPV VACCINES  Aged Out    Discussed health benefits of physical activity, and encouraged her to engage in regular exercise appropriate for her age and condition.  Problem List Items Addressed This Visit       Musculoskeletal and Integument   Osteoarthritis   Relevant  Medications   meloxicam (MOBIC) 7.5 MG tablet     Other   Seasonal allergies   Encounter for subsequent annual wellness visit (AWV) in Medicare patient - Primary   Relevant Orders   CBC with Differential/Platelet   Comprehensive metabolic panel   Lipid panel   Magnesium   TSH   VITAMIN D 25 Hydroxy (Vit-D Deficiency, Fractures)   Chronic elbow pain, left   Relevant Medications   meloxicam (MOBIC) 7.5 MG tablet   Other Relevant Orders   Ambulatory referral to Sports Medicine   Elevated serum glucose   Relevant Orders   Hemoglobin A1c   Encounter for vitamin deficiency screening   Need for 23-polyvalent pneumococcal polysaccharide vaccine   Relevant Orders   Pneumococcal polysaccharide vaccine 23-valent greater than or equal to 2yo subcutaneous/IM   Need for diphtheria-tetanus-pertussis (Tdap) vaccine   Relevant Orders   Tdap vaccine greater than or equal to 7yo IM     Return in about 1 year (around 05/22/2022) for annual examination.     IVonna Kotyk FNP, have reviewed all documentation for this visit. The documentation on 05/22/21 for the exam, diagnosis, procedures, and orders are all accurate and complete.    EGwyneth Sprout [phone](phone) [phone](fax)  [facility]

## 2021-05-22 NOTE — Assessment & Plan Note (Signed)
Denies spasms Denies urge/stress sources Wearing pad to help with dribbling; noticed with occ changes in body position or heavy lifting

## 2021-05-22 NOTE — Assessment & Plan Note (Signed)
Pulled for A1c screening

## 2021-05-22 NOTE — Assessment & Plan Note (Signed)
Much improved since masking for Covid; no longer getting annual bronchitis Continue to monitor

## 2021-05-23 LAB — COMPREHENSIVE METABOLIC PANEL
ALT: 11 IU/L (ref 0–32)
AST: 18 IU/L (ref 0–40)
Albumin/Globulin Ratio: 2.3 — ABNORMAL HIGH (ref 1.2–2.2)
Albumin: 4.5 g/dL (ref 3.8–4.8)
Alkaline Phosphatase: 67 IU/L (ref 44–121)
BUN/Creatinine Ratio: 19 (ref 12–28)
BUN: 13 mg/dL (ref 8–27)
Bilirubin Total: 0.9 mg/dL (ref 0.0–1.2)
CO2: 26 mmol/L (ref 20–29)
Calcium: 9.8 mg/dL (ref 8.7–10.3)
Chloride: 103 mmol/L (ref 96–106)
Creatinine, Ser: 0.68 mg/dL (ref 0.57–1.00)
Globulin, Total: 2 g/dL (ref 1.5–4.5)
Glucose: 97 mg/dL (ref 65–99)
Potassium: 4.3 mmol/L (ref 3.5–5.2)
Sodium: 140 mmol/L (ref 134–144)
Total Protein: 6.5 g/dL (ref 6.0–8.5)
eGFR: 95 mL/min/{1.73_m2} (ref >59)

## 2021-05-23 LAB — CBC WITH DIFFERENTIAL/PLATELET
Basophils Absolute: 0.1 10*3/uL (ref 0.0–0.2)
Basos: 1 %
EOS (ABSOLUTE): 0.1 10*3/uL (ref 0.0–0.4)
Eos: 3 %
Hematocrit: 40.2 % (ref 34.0–46.6)
Hemoglobin: 13.1 g/dL (ref 11.1–15.9)
Immature Grans (Abs): 0 10*3/uL (ref 0.0–0.1)
Immature Granulocytes: 0 %
Lymphocytes Absolute: 1.5 10*3/uL (ref 0.7–3.1)
Lymphs: 31 %
MCH: 29.2 pg (ref 26.6–33.0)
MCHC: 32.6 g/dL (ref 31.5–35.7)
MCV: 90 fL (ref 79–97)
Monocytes Absolute: 0.5 10*3/uL (ref 0.1–0.9)
Monocytes: 9 %
Neutrophils Absolute: 2.8 10*3/uL (ref 1.4–7.0)
Neutrophils: 56 %
Platelets: 250 10*3/uL (ref 150–450)
RBC: 4.49 x10E6/uL (ref 3.77–5.28)
RDW: 12.9 % (ref 11.7–15.4)
WBC: 5 10*3/uL (ref 3.4–10.8)

## 2021-05-23 LAB — HEMOGLOBIN A1C
Est. average glucose Bld gHb Est-mCnc: 123 mg/dL
Hgb A1c MFr Bld: 5.9 % — ABNORMAL HIGH (ref 4.8–5.6)

## 2021-05-23 LAB — LIPID PANEL
Chol/HDL Ratio: 3.9 ratio (ref 0.0–4.4)
Cholesterol, Total: 205 mg/dL — ABNORMAL HIGH (ref 100–199)
HDL: 53 mg/dL (ref >39)
LDL Chol Calc (NIH): 136 mg/dL — ABNORMAL HIGH (ref 0–99)
Triglycerides: 88 mg/dL (ref 0–149)
VLDL Cholesterol Cal: 16 mg/dL (ref 5–40)

## 2021-05-23 LAB — TSH: TSH: 1.85 u[IU]/mL (ref 0.450–4.500)

## 2021-05-23 LAB — VITAMIN D 25 HYDROXY (VIT D DEFICIENCY, FRACTURES): Vit D, 25-Hydroxy: 33.9 ng/mL (ref 30.0–100.0)

## 2021-05-23 LAB — MAGNESIUM: Magnesium: 2.1 mg/dL (ref 1.6–2.3)

## 2021-06-02 DIAGNOSIS — S0012XA Contusion of left eyelid and periocular area, initial encounter: Secondary | ICD-10-CM | POA: Diagnosis not present

## 2022-02-18 ENCOUNTER — Other Ambulatory Visit: Payer: Self-pay | Admitting: Family Medicine

## 2022-02-18 DIAGNOSIS — M1711 Unilateral primary osteoarthritis, right knee: Secondary | ICD-10-CM

## 2022-04-21 ENCOUNTER — Other Ambulatory Visit: Payer: Self-pay | Admitting: Family Medicine

## 2022-04-21 DIAGNOSIS — Z1231 Encounter for screening mammogram for malignant neoplasm of breast: Secondary | ICD-10-CM

## 2022-04-22 ENCOUNTER — Encounter: Payer: Self-pay | Admitting: Family Medicine

## 2022-04-22 ENCOUNTER — Ambulatory Visit (INDEPENDENT_AMBULATORY_CARE_PROVIDER_SITE_OTHER): Payer: PPO | Admitting: Family Medicine

## 2022-04-22 VITALS — BP 127/83 | HR 78 | Temp 97.6°F | Resp 16 | Wt 141.0 lb

## 2022-04-22 DIAGNOSIS — R7303 Prediabetes: Secondary | ICD-10-CM | POA: Diagnosis not present

## 2022-04-22 DIAGNOSIS — M436 Torticollis: Secondary | ICD-10-CM | POA: Insufficient documentation

## 2022-04-22 DIAGNOSIS — Z0000 Encounter for general adult medical examination without abnormal findings: Secondary | ICD-10-CM

## 2022-04-22 DIAGNOSIS — L608 Other nail disorders: Secondary | ICD-10-CM | POA: Insufficient documentation

## 2022-04-22 MED ORDER — CYCLOBENZAPRINE HCL 10 MG PO TABS: ORAL_TABLET | ORAL | 0 refills | Status: DC

## 2022-04-22 NOTE — Assessment & Plan Note (Signed)
Discussed annual dental and vision screening Mammo scheduled Reports hx of colo guard; will call for receipt of exam >65- defer PAP Things to do to keep yourself healthy  - Exercise at least 30-45 minutes a day, 3-4 days a week.  - Eat a low-fat diet with lots of fruits and vegetables, up to 7-9 servings per day.  - Seatbelts can save your life. Wear them always.  - Smoke detectors on every level of your home, check batteries every year.  - Eye Doctor - have an eye exam every 1-2 years  - Safe sex - if you may be exposed to STDs, use a condom.  - Alcohol -  If you drink, do it moderately, less than 2 drinks per day.  - [place]. Choose someone to speak for you if you are not able.  - Depression is common in our stressful world.If you're feeling down or losing interest in things you normally enjoy, please come in for a visit.  - Violence - If anyone is threatening or hurting you, please call immediately.

## 2022-04-22 NOTE — Patient Instructions (Signed)
The CDC recommends two doses of Shingrix (the new shingles vaccine) separated by 2 to 6 months for adults age 68 years and older. I recommend checking with your insurance plan regarding coverage for this vaccine.    

## 2022-04-22 NOTE — Assessment & Plan Note (Signed)
Reports 4 weeks R and L R worse Limited ROM when looking side to side Denies mechanism of injury Has improved with aleve q12 hrs OTC Trial of flexeril 5-10 mg qHS PRN Defer referral to ortho at this time

## 2022-04-22 NOTE — Assessment & Plan Note (Signed)
Chronic, worsening Check CMP for liver function Discussed options of treatment Defer referral to podiatry at this time

## 2022-04-22 NOTE — Progress Notes (Signed)
I,Jana Robinson,acting as a Education administrator for Gwyneth Sprout, FNP.,have documented all relevant documentation on the behalf of Gwyneth Sprout, FNP,as directed by  Gwyneth Sprout, FNP while in the presence of Gwyneth Sprout, FNP.   Complete physical exam   Patient: Ellen Montoya   DOB: [birth year]   66 y.o. Female  MRN: [id number] Visit Date: 04/22/2022  Today's healthcare provider: Gwyneth Sprout, FNP  Introduced to nurse practitioner role and practice setting.  All questions answered.  Discussed provider/patient relationship and expectations.   Chief Complaint  Patient presents with   Annual Exam   Subjective    Ellen Montoya is a 66 y.o. female who presents today for a complete physical exam.  She reports consuming a general diet. Gym/ health club routine includes piliates. She generally feels well. She reports sleeping well. She does have additional problems to discuss today.  Neck pain / hurts to turn x 1 month. Toenail yellowing   Past Medical History:  Diagnosis Date   Acquired trigger finger 09/08/2019   Acute meniscal tear, medial 09/08/2019   Allergy    Arthritis    Cyst of tendon sheath 09/08/2019   GERD (gastroesophageal reflux disease)    Routine physical examination 12/17/2016   Past Surgical History:  Procedure Laterality Date   JOINT REPLACEMENT     laprascopy     Social History   Socioeconomic History   Marital status: Married    Spouse name: Not on file   Number of children: Not on file   Years of education: Not on file   Highest education level: Not on file  Occupational History   Not on file  Tobacco Use   Smoking status: Never   Smokeless tobacco: Never  Substance and Sexual Activity   Alcohol use: No   Drug use: No   Sexual activity: Not on file  Other Topics Concern   Not on file  Social History Narrative   From [place]      Married      3 -children      Work at [employer] Strain: Not on file  Food Insecurity: Not on file  Transportation Needs: Not on file  Physical Activity: Not on file  Stress: Not on file  Social Connections: Not on file  Intimate Partner Violence: Not on file   Family Status  Relation Name Status   Father  Alive   MGM  (Not Specified)   Mother  Deceased   Neg Hx  (Not Specified)   Family History  Problem Relation Age of Onset   Heart disease Father        thinks a MI   Heart disease Maternal Grandmother    Leukemia Mother    Ovarian cancer Neg Hx    Colon cancer Neg Hx    Breast cancer Neg Hx    Allergies  Allergen Reactions   Pollen Extract-Tree Extract [Pollen Extract]     Patient Care Team: Gwyneth Sprout, FNP as PCP - General (Family Medicine)   Medications: Outpatient Medications Prior to Visit  Medication Sig   naproxen sodium (ALEVE) 220 MG tablet Take 220 mg by mouth daily as needed.   [DISCONTINUED] meloxicam (MOBIC) 7.5 MG tablet Take 1 tablet (7.5 mg total) by mouth daily.   No facility-administered medications prior to visit.    Review of Systems  Musculoskeletal:  Positive  for neck pain.  All other systems reviewed and are negative.   Last CBC Lab Results  Component Value Date   WBC 5.0 05/22/2021   HGB 13.1 05/22/2021   HCT 40.2 05/22/2021   MCV 90 05/22/2021   MCH 29.2 05/22/2021   RDW 12.9 05/22/2021   PLT 250 34/19/6222   Last metabolic panel Lab Results  Component Value Date   GLUCOSE 97 05/22/2021   NA 140 05/22/2021   K 4.3 05/22/2021   CL 103 05/22/2021   CO2 26 05/22/2021   BUN 13 05/22/2021   CREATININE 0.68 05/22/2021   EGFR 95 05/22/2021   CALCIUM 9.8 05/22/2021   PROT 6.5 05/22/2021   ALBUMIN 4.5 05/22/2021   LABGLOB 2.0 05/22/2021   AGRATIO 2.3 (H) 05/22/2021   BILITOT 0.9 05/22/2021   ALKPHOS 67 05/22/2021   AST 18 05/22/2021   ALT 11 05/22/2021   Last lipids Lab Results  Component Value Date   CHOL 205 (H) 05/22/2021   HDL 53  05/22/2021   LDLCALC 136 (H) 05/22/2021   TRIG 88 05/22/2021   CHOLHDL 3.9 05/22/2021   Last hemoglobin A1c Lab Results  Component Value Date   HGBA1C 5.9 (H) 05/22/2021   Last thyroid functions Lab Results  Component Value Date   TSH 1.850 05/22/2021   Last vitamin D Lab Results  Component Value Date   VD25OH 33.9 05/22/2021      Objective     BP 127/83 (BP Location: Right Arm, Patient Position: Sitting, Cuff Size: Normal)   Pulse 78   Temp 97.6 F (36.4 C) (Oral)   Resp 16   Wt 141 lb (64 kg)   SpO2 99%   BMI 24.98 kg/m   BP Readings from Last 3 Encounters:  04/22/22 127/83  05/22/21 121/76  06/18/20 115/90   Wt Readings from Last 3 Encounters:  04/22/22 141 lb (64 kg)  05/22/21 131 lb 12.8 oz (59.8 kg)  05/21/20 139 lb 12.8 oz (63.4 kg)   SpO2 Readings from Last 3 Encounters:  04/22/22 99%  05/22/21 99%  06/18/20 98%       Physical Exam Vitals and nursing note reviewed.  Constitutional:      General: She is awake. She is not in acute distress.    Appearance: Normal appearance. She is well-developed, well-groomed and normal weight. She is not ill-appearing, toxic-appearing or diaphoretic.  HENT:     Head: Normocephalic and atraumatic.     Jaw: There is normal jaw occlusion. No trismus, tenderness, swelling or pain on movement.     Right Ear: Hearing, tympanic membrane, ear canal and external ear normal. There is no impacted cerumen.     Left Ear: Hearing, tympanic membrane, ear canal and external ear normal. There is no impacted cerumen.     Nose: Nose normal. No congestion or rhinorrhea.     Right Turbinates: Not enlarged, swollen or pale.     Left Turbinates: Not enlarged, swollen or pale.     Right Sinus: No maxillary sinus tenderness or frontal sinus tenderness.     Left Sinus: No maxillary sinus tenderness or frontal sinus tenderness.     Mouth/Throat:     Lips: Pink.     Mouth: Mucous membranes are moist. No injury.     Tongue: No lesions.      Pharynx: Oropharynx is clear. Uvula midline. No pharyngeal swelling, oropharyngeal exudate, posterior oropharyngeal erythema or uvula swelling.     Tonsils: No tonsillar exudate or tonsillar abscesses.  Eyes:     General: Lids are normal. Lids are everted, no foreign bodies appreciated. Vision grossly intact. Gaze aligned appropriately. No allergic shiner or visual field deficit.       Right eye: No discharge.        Left eye: No discharge.     Extraocular Movements: Extraocular movements intact.     Conjunctiva/sclera: Conjunctivae normal.     Right eye: Right conjunctiva is not injected. No exudate.    Left eye: Left conjunctiva is not injected. No exudate.    Pupils: Pupils are equal, round, and reactive to light.  Neck:     Thyroid: No thyroid mass, thyromegaly or thyroid tenderness.     Vascular: No carotid bruit.     Trachea: Trachea normal.      Comments: R>L; denies mechanism of injury, present for >4 wks Cardiovascular:     Rate and Rhythm: Normal rate and regular rhythm.     Pulses: Normal pulses.          Carotid pulses are 2+ on the right side and 2+ on the left side.      Radial pulses are 2+ on the right side and 2+ on the left side.       Dorsalis pedis pulses are 2+ on the right side and 2+ on the left side.       Posterior tibial pulses are 2+ on the right side and 2+ on the left side.     Heart sounds: Normal heart sounds, S1 normal and S2 normal. No murmur heard.    No friction rub. No gallop.  Pulmonary:     Effort: Pulmonary effort is normal. No respiratory distress.     Breath sounds: Normal breath sounds and air entry. No stridor. No wheezing, rhonchi or rales.  Chest:     Chest wall: No tenderness.     Comments: Breasts: risk and benefit of breast self-exam was discussed, not examined, patient declines to have breast exam  Abdominal:     General: Abdomen is flat. Bowel sounds are normal. There is no distension.     Palpations: Abdomen is soft. There is  no mass.     Tenderness: There is no abdominal tenderness. There is no right CVA tenderness, left CVA tenderness, guarding or rebound.     Hernia: No hernia is present.  Genitourinary:    Comments: Exam deferred; denies complaints Musculoskeletal:        General: No swelling, tenderness, deformity or signs of injury. Normal range of motion.     Cervical back: Normal range of motion and neck supple. No edema, rigidity or tenderness. Pain with movement and muscular tenderness present.     Right lower leg: No edema.     Left lower leg: No edema.  Lymphadenopathy:     Cervical: No cervical adenopathy.     Right cervical: No superficial, deep or posterior cervical adenopathy.    Left cervical: No superficial, deep or posterior cervical adenopathy.  Skin:    General: Skin is warm and dry.     Capillary Refill: Capillary refill takes less than 2 seconds.     Coloration: Skin is not jaundiced or pale.     Findings: No bruising, erythema, lesion or rash.  Neurological:     General: No focal deficit present.     Mental Status: She is alert and oriented to person, place, and time. Mental status is at baseline.     GCS: GCS eye subscore is 4.  GCS verbal subscore is 5. GCS motor subscore is 6.     Sensory: Sensation is intact. No sensory deficit.     Motor: Motor function is intact. No weakness.     Coordination: Coordination is intact. Coordination normal.     Gait: Gait is intact. Gait normal.  Psychiatric:        Attention and Perception: Attention and perception normal.        Mood and Affect: Mood and affect normal.        Speech: Speech normal.        Behavior: Behavior normal. Behavior is cooperative.        Thought Content: Thought content normal.        Cognition and Memory: Cognition and memory normal.        Judgment: Judgment normal.    Last depression screening scores    04/22/2022   10:44 AM 05/22/2021    8:50 AM 09/08/2019    2:04 PM  PHQ 2/9 Scores  PHQ - 2 Score 0 0 0   PHQ- 9 Score 0 0 0   Last fall risk screening    04/22/2022   10:43 AM  Fall Risk   Falls in the past year? 0  Number falls in past yr: 0  Injury with Fall? 0  Follow up Falls evaluation completed   Last Audit-C alcohol use screening    04/22/2022   10:44 AM  Alcohol Use Disorder Test (AUDIT)  1. How often do you have a drink containing alcohol? 0  3. How often do you have six or more drinks on one occasion? 0   A score of 3 or more in women, and 4 or more in men indicates increased risk for alcohol abuse, EXCEPT if all of the points are from question 1   No results found for any visits on 04/22/22.  Assessment & Plan    Routine Health Maintenance and Physical Exam  Exercise Activities and Dietary recommendations  Goals   None     Immunization History  Administered Date(s) Administered   Influenza,inj,Quad PF,6+ Mos 12/17/2016, 10/16/2018   Influenza-Unspecified 06/19/2017   Pneumococcal Conjugate-13 05/21/2020   Pneumococcal Polysaccharide-23 05/22/2021   Tdap 05/22/2021    Health Maintenance  Topic Date Due   COVID-19 Vaccine (1) Never done   COLONOSCOPY (Pts 45-105yr Insurance coverage will need to be confirmed)  Never done   Zoster Vaccines- Shingrix (1 of 2) Never done   INFLUENZA VACCINE  04/21/2022   MAMMOGRAM  01/09/2023   DEXA SCAN  07/30/2025   TETANUS/TDAP  05/23/2031   Pneumonia Vaccine 68 Years old  Completed   Hepatitis C Screening  Completed   HPV VACCINES  Aged Out    Discussed health benefits of physical activity, and encouraged her to engage in regular exercise appropriate for her age and condition.  Problem List Items Addressed This Visit       Other   Annual physical exam - Primary    Discussed annual dental and vision screening Mammo scheduled Reports hx of colo guard; will call for receipt of exam >65- defer PAP Things to do to keep yourself healthy  - Exercise at least 30-45 minutes a day, 3-4 days a week.  - Eat a low-fat  diet with lots of fruits and vegetables, up to 7-9 servings per day.  - Seatbelts can save your life. Wear them always.  - Smoke detectors on every level of your home, check batteries every year.  -  Eye Doctor - have an eye exam every 1-2 years  - Safe sex - if you may be exposed to STDs, use a condom.  - Alcohol -  If you drink, do it moderately, less than 2 drinks per day.  - [place]. Choose someone to speak for you if you are not able.  - Depression is common in our stressful world.If you're feeling down or losing interest in things you normally enjoy, please come in for a visit.  - Violence - If anyone is threatening or hurting you, please call immediately.        Relevant Orders   Comprehensive Metabolic Panel (CMET)   CBC with Differential/Platelet   TSH + free T4   Lipid panel   Hemoglobin A1c   Discoloration and thickening of nails both feet    Chronic, worsening Check CMP for liver function Discussed options of treatment Defer referral to podiatry at this time       Neck stiffness    Reports 4 weeks R and L R worse Limited ROM when looking side to side Denies mechanism of injury Has improved with aleve q12 hrs OTC Trial of flexeril 5-10 mg qHS PRN Defer referral to ortho at this time      Relevant Medications   cyclobenzaprine (FLEXERIL) 10 MG tablet   Prediabetes    Repeat A1c. Prev 5.9% Continue to recommend balanced, lower carb meals. Smaller meal size, adding snacks. Choosing water as drink of choice and increasing purposeful exercise. Previously controlled with diet/exercise        Return in about 4 weeks (around 05/20/2022), or neck pain.     Vonna Kotyk, FNP, have reviewed all documentation for this visit. The documentation on 04/22/22 for the exam, diagnosis, procedures, and orders are all accurate and complete.    Gwyneth Sprout, [place] [phone] (phone) [phone] (fax)  [place]

## 2022-04-22 NOTE — Assessment & Plan Note (Signed)
Repeat A1c. Prev 5.9% Continue to recommend balanced, lower carb meals. Smaller meal size, adding snacks. Choosing water as drink of choice and increasing purposeful exercise. Previously controlled with diet/exercise

## 2022-04-23 ENCOUNTER — Other Ambulatory Visit: Payer: Self-pay

## 2022-04-23 DIAGNOSIS — E7800 Pure hypercholesterolemia, unspecified: Secondary | ICD-10-CM

## 2022-04-23 LAB — CBC WITH DIFFERENTIAL/PLATELET
Basophils Absolute: 0.1 10*3/uL (ref 0.0–0.2)
Basos: 1 %
EOS (ABSOLUTE): 0.2 10*3/uL (ref 0.0–0.4)
Eos: 3 %
Hematocrit: 39.3 % (ref 34.0–46.6)
Hemoglobin: 13.6 g/dL (ref 11.1–15.9)
Immature Grans (Abs): 0 10*3/uL (ref 0.0–0.1)
Immature Granulocytes: 0 %
Lymphocytes Absolute: 1.4 10*3/uL (ref 0.7–3.1)
Lymphs: 24 %
MCH: 30.4 pg (ref 26.6–33.0)
MCHC: 34.6 g/dL (ref 31.5–35.7)
MCV: 88 fL (ref 79–97)
Monocytes Absolute: 0.6 10*3/uL (ref 0.1–0.9)
Monocytes: 10 %
Neutrophils Absolute: 3.6 10*3/uL (ref 1.4–7.0)
Neutrophils: 62 %
Platelets: 249 10*3/uL (ref 150–450)
RBC: 4.48 x10E6/uL (ref 3.77–5.28)
RDW: 12.6 % (ref 11.7–15.4)
WBC: 5.9 10*3/uL (ref 3.4–10.8)

## 2022-04-23 LAB — COMPREHENSIVE METABOLIC PANEL
ALT: 12 IU/L (ref 0–32)
AST: 15 IU/L (ref 0–40)
Albumin/Globulin Ratio: 2 (ref 1.2–2.2)
Albumin: 4.5 g/dL (ref 3.9–4.9)
Alkaline Phosphatase: 78 IU/L (ref 44–121)
BUN/Creatinine Ratio: 20 (ref 12–28)
BUN: 14 mg/dL (ref 8–27)
Bilirubin Total: 0.9 mg/dL (ref 0.0–1.2)
CO2: 23 mmol/L (ref 20–29)
Calcium: 9.9 mg/dL (ref 8.7–10.3)
Chloride: 103 mmol/L (ref 96–106)
Creatinine, Ser: 0.69 mg/dL (ref 0.57–1.00)
Globulin, Total: 2.3 g/dL (ref 1.5–4.5)
Glucose: 98 mg/dL (ref 70–99)
Potassium: 4.3 mmol/L (ref 3.5–5.2)
Sodium: 141 mmol/L (ref 134–144)
Total Protein: 6.8 g/dL (ref 6.0–8.5)
eGFR: 95 mL/min/{1.73_m2} (ref >59)

## 2022-04-23 LAB — LIPID PANEL
Chol/HDL Ratio: 4.1 ratio (ref 0.0–4.4)
Cholesterol, Total: 212 mg/dL — ABNORMAL HIGH (ref 100–199)
HDL: 52 mg/dL (ref >39)
LDL Chol Calc (NIH): 125 mg/dL — ABNORMAL HIGH (ref 0–99)
Triglycerides: 199 mg/dL — ABNORMAL HIGH (ref 0–149)
VLDL Cholesterol Cal: 35 mg/dL (ref 5–40)

## 2022-04-23 LAB — HEMOGLOBIN A1C
Est. average glucose Bld gHb Est-mCnc: 120 mg/dL
Hgb A1c MFr Bld: 5.8 % — ABNORMAL HIGH (ref 4.8–5.6)

## 2022-04-23 LAB — TSH+FREE T4
Free T4: 1.09 ng/dL (ref 0.82–1.77)
TSH: 2.2 u[IU]/mL (ref 0.450–4.500)

## 2022-04-23 MED ORDER — ROSUVASTATIN CALCIUM 10 MG PO TABS: 10.0000 mg | ORAL_TABLET | Freq: Every day | ORAL | 1 refills | Status: DC

## 2022-04-23 NOTE — Progress Notes (Signed)
Cholesterol is increased; however, slight improvement seen in bad/LDL cholesterol. The 10-year ASCVD risk score (Arnett DK, et al., 2019) is: 7.1%   Values used to calculate the score:     Age: 66 years     Sex: Female     Is Non-Hispanic African American: No     Diabetic: No     Tobacco smoker: No     Systolic Blood Pressure: 552 mmHg     Is BP treated: No     HDL Cholesterol: 52 mg/dL     Total Cholesterol: 212 mg/dL Heart attack and stroke risk is 7% estimated within the next 10 years which is moderate. Recommend starting Crestor 10 mg. Please let us know if you would like to start on this medication.  Pre-diabetes remains. Continue to recommend balanced, lower carb meals. Smaller meal size, adding snacks. Choosing water as drink of choice and increasing purposeful exercise.  Recommend starting Metformin XR 750 mg. Please let us know if you would like to start on this medication.  Please let us know if you have any questions.  Thank you, Gwyneth Sprout, [address] [place], [place] [address] [phone] (phone) [phone] (fax) [place]

## 2022-05-17 ENCOUNTER — Other Ambulatory Visit: Payer: Self-pay | Admitting: Family Medicine

## 2022-05-17 DIAGNOSIS — E7800 Pure hypercholesterolemia, unspecified: Secondary | ICD-10-CM

## 2022-05-19 ENCOUNTER — Ambulatory Visit
Admission: RE | Admit: 2022-05-19 | Discharge: 2022-05-19 | Disposition: A | Discharge status: Home / Self Care | Payer: PPO | Source: Ambulatory Visit | Attending: Family Medicine | Admitting: Family Medicine

## 2022-05-19 DIAGNOSIS — Z1231 Encounter for screening mammogram for malignant neoplasm of breast: Secondary | ICD-10-CM | POA: Insufficient documentation

## 2022-05-21 NOTE — Progress Notes (Signed)
Hi Ellen Montoya  Normal mammogram; repeat in 1 year.  Please let us know if you have any questions.  Thank you,  Tally Joe, FNP

## 2022-06-09 ENCOUNTER — Other Ambulatory Visit: Payer: HMO

## 2022-06-09 ENCOUNTER — Encounter
Admission: RE | Admit: 2022-06-09 | Discharge: 2022-06-09 | Disposition: A | Discharge status: Home / Self Care | Payer: PPO | Source: Ambulatory Visit | Attending: Orthopedic Surgery | Admitting: Orthopedic Surgery

## 2022-06-09 VITALS — BP 107/75 | HR 87 | Resp 14 | Ht 63.0 in | Wt 142.0 lb

## 2022-06-09 DIAGNOSIS — R739 Hyperglycemia, unspecified: Secondary | ICD-10-CM

## 2022-06-09 DIAGNOSIS — Y838 Other surgical procedures as the cause of abnormal reaction of the patient, or of later complication, without mention of misadventure at the time of the procedure: Secondary | ICD-10-CM | POA: Insufficient documentation

## 2022-06-09 DIAGNOSIS — Z01818 Encounter for other preprocedural examination: Secondary | ICD-10-CM | POA: Insufficient documentation

## 2022-06-09 DIAGNOSIS — T84038A Mechanical loosening of other internal prosthetic joint, initial encounter: Secondary | ICD-10-CM | POA: Insufficient documentation

## 2022-06-09 DIAGNOSIS — Z96659 Presence of unspecified artificial knee joint: Secondary | ICD-10-CM | POA: Diagnosis not present

## 2022-06-09 DIAGNOSIS — R7303 Prediabetes: Secondary | ICD-10-CM | POA: Diagnosis not present

## 2022-06-09 DIAGNOSIS — Y798 Miscellaneous orthopedic devices associated with adverse incidents, not elsewhere classified: Secondary | ICD-10-CM | POA: Insufficient documentation

## 2022-06-09 HISTORY — DX: Other specified postprocedural states: R11.2

## 2022-06-09 HISTORY — DX: Other specified postprocedural states: Z98.890

## 2022-06-09 HISTORY — DX: Hyperlipidemia, unspecified: E78.5

## 2022-06-09 LAB — TYPE AND SCREEN
ABO/RH(D): O POS
Antibody Screen: NEGATIVE

## 2022-06-09 LAB — URINALYSIS, ROUTINE W REFLEX MICROSCOPIC
Bilirubin Urine: NEGATIVE
Glucose, UA: NEGATIVE mg/dL
Hgb urine dipstick: NEGATIVE
Ketones, ur: NEGATIVE mg/dL
Leukocytes,Ua: NEGATIVE
Nitrite: NEGATIVE
Protein, ur: NEGATIVE mg/dL
Specific Gravity, Urine: 1.011 (ref 1.005–1.030)
pH: 7 (ref 5.0–8.0)

## 2022-06-09 LAB — SEDIMENTATION RATE: Sed Rate: 5 mm/hr (ref 0–30)

## 2022-06-09 LAB — C-REACTIVE PROTEIN: CRP: 0.6 mg/dL (ref <1.0)

## 2022-06-09 LAB — SURGICAL PCR SCREEN
MRSA, PCR: NEGATIVE
Staphylococcus aureus: NEGATIVE

## 2022-06-09 NOTE — Patient Instructions (Signed)
Your procedure is scheduled on: 06/17/22 Report to [facility]. To find out your arrival time please call [phone] between 1PM - 3PM on 06/16/22.  Remember: Instructions that are not followed completely may result in serious medical risk, up to and including death, or upon the discretion of your surgeon and anesthesiologist your surgery may need to be rescheduled.     _X__ 1. Do not eat food after midnight the night before your procedure.                 No gum chewing or hard candies. You may drink clear liquids up to 2 hours                 before you are scheduled to arrive for your surgery- DO not drink clear                 liquids within 2 hours of the start of your surgery.                 Clear Liquids include:  water, apple juice without pulp, clear carbohydrate                 drink such as Clearfast or Gatorade, Black Coffee or Tea (Do not add                 anything to coffee or tea). Diabetics water only  Drink the Ensure "clear" pre surgery drink 2 hours prior to arrival the day of surgery  __X__2.  On the morning of surgery brush your teeth with toothpaste and water, you                 may rinse your mouth with mouthwash if you wish.  Do not swallow any              toothpaste of mouthwash.     _X__ 3.  No Alcohol for 24 hours before or after surgery.   _X__ 4.  Do Not Smoke or use e-cigarettes For 24 Hours Prior to Your Surgery.                 Do not use any chewable tobacco products for at least 6 hours prior to                 surgery.  ____  5.  Bring all medications with you on the day of surgery if instructed.   __X__  6.  Notify your doctor if there is any change in your medical condition      (cold, fever, infections).     Do not wear jewelry, make-up, hairpins, clips or nail polish. Do not wear lotions, powders, or perfumes. You may wear deodorant. Do not shave body hair 48 hours prior to surgery. Men  may shave face and neck. Do not bring valuables to the hospital.    [facility] is not responsible for any belongings or valuables.  Contacts, dentures/partials or body piercings may not be worn into surgery. Bring a case for your contacts, glasses or hearing aids, a denture cup will be supplied. Leave your suitcase in the car. After surgery it may be brought to your room. For patients admitted to the hospital, discharge time is determined by your treatment team.   Patients discharged the day of surgery will not be allowed to drive home.   Please read over the following fact sheets that you  were given:   MRSA Information, CHG soap, Ensure, Incentive Spirometer  __X__ Take these medicines the morning of surgery with A SIP OF WATER:    1. none  2.   3.   4.  5.  6.  ____ Fleet Enema (as directed)   __X__ Use CHG Soap/SAGE wipes as directed  ____ Use inhalers on the day of surgery  ____ Stop metformin/Janumet/Farxiga 2 days prior to surgery    ____ Take 1/2 of usual insulin dose the night before surgery. No insulin the morning          of surgery.   ____ Stop Blood Thinners Coumadin/Plavix/Xarelto/Pleta/Pradaxa/Eliquis/Effient/Aspirin  on   Or contact your Surgeon, Cardiologist or Medical Doctor regarding  ability to stop your blood thinners  __X__ Stop Anti-inflammatories 7 days before surgery such as Advil, Ibuprofen, Motrin,  BC or Goodies Powder, Naprosyn, Naproxen, Aleve, Aspirin   You may take Tylenol instead as needed. Do not exceed 3000 mg per day  __X__ Stop all herbals and supplements, fish oil or vitamins  until after surgery.    ____ Bring C-Pap to the hospital.

## 2022-06-14 NOTE — H&P (Signed)
ORTHOPAEDIC HISTORY & PHYSICAL Regino Bellow, PA - 06/09/2022 11:00 AM EDT Formatting of this note is different from the original. Images from the original note were not included. Chief Complaint Chief Complaint  Patient presents with  Left Knee - Follow-up  H&P: Conversion L Partial Knee to L TKA:06/17/22   Reason for Visit Ellen Montoya is a 66 y.o. who presents today for a history and physical. She is to undergo a conversion of a partial total knee to a total arthroplasty on 06/17/2022. Patient was last here in the clinic on 03/26/2022. There is been no change in her condition since that time.  She underwent left medial unicompartmental knee arthroplasty on June 10, 2015 as per Dr. Delarosa Mince. She did well initially, but has had some progressive left knee pain over the last year. She also notices progressive "bowing" of the left knee. She reports some swelling of the knee but denies any locking or giving way of the knee. The pain is aggravated by weightbearing activities and stair ambulation. She has not appreciated any improvement with NSAIDs. She is not using any ambulatory aids. She denies any fevers, chills, or erythema/increased warmth to the knee.  Patient states that her condition is increased to the point that is significantly interfering with her activities of daily living and wishes to proceed with surgery.  Past Medical History Past Medical History:  Diagnosis Date  Osteoarthritis   Past Surgical History Past Surgical History:  Procedure Laterality Date  Knee arthroscopy with partial medial and lateral meniscectomy 09/05/2013  Left medial unicompartmental replacement 06/10/2015  Dr Runkles Mince  TONSILLECTOMY   Past Family History Family History  Problem Relation Age of Onset  Leukemia Mother  Heart disease Father   Medications Current Outpatient Medications Ordered in Epic  Medication Sig Dispense Refill  cetirizine (ZYRTEC) 10 MG tablet Take 10 mg by  mouth once daily as needed  meclizine (ANTIVERT) 25 mg tablet Take 25 mg by mouth 3 (three) times daily as needed for Dizziness  naproxen sodium (ALEVE) 220 MG tablet Take 220 mg by mouth once daily as needed for Pain  omeprazole (PRILOSEC) 10 MG DR capsule Take 10 mg by mouth once daily as needed  rosuvastatin (CRESTOR) 10 MG tablet Take 1 tablet by mouth once daily  terbinafine HCL (LAMISIL) 250 mg tablet Take 250 mg by mouth once daily   No current Epic-ordered facility-administered medications on file.   Allergies No Known Allergies   Review of Systems A comprehensive 14 point ROS was performed, reviewed, and the pertinent orthopaedic findings are documented in the HPI.  Exam BP 122/84 (BP Location: Left upper arm, Patient Position: Sitting, BP Cuff Size: Adult)  Ht 160 cm ('5\' 3"'$ )  Wt 64.7 kg (142 lb 9.6 oz)  BMI 25.26 kg/m   General: Well-developed well-nourished female seen in no acute distress.   HEENT: Atraumatic,normocephalic. Pupils are equal and reactive to light. Oropharynx is clear with moist mucosa  Lungs: Clear to auscultation bilaterally   Cardiovascular: Regular rate and rhythm. Normal S1, S2. No murmurs. No appreciable gallops or rubs. Peripheral pulses are palpable.  Abdomen: Soft, non-tender, nondistended. Bowel sounds present  Extremity: Left Knee: Soft tissue swelling: moderate Effusion: minimal Erythema: none Crepitance: none Tenderness: medial, patellar Alignment: relative varus Mediolateral laxity: medial pseudolaxity Patellar tracking: Good tracking without evidence of subluxation or tilt Atrophy: No significant atrophy. Quadriceps tone was fair to good. Range of motion: 0/2/132 degrees   Neurological:  The patient is alert and  oriented Sensation to light touch appears to be intact and within normal limits Gross motor strength appeared to be equal to 5/5  Vascular :  Peripheral pulses felt to be palpable. Capillary refill appears  to be intact and within normal limits  X-ray  1. X-rays of the left knee taken on 03/26/2022 showed medial unicompartmental knee arthroplasty implants are in place. There is varus alignment with possible polyethylene wear. Radiolucencies are noted at the bone cement implant interface.. No indication of any fractures or dislocations.  Impression  1. Left medial unicompartmental left arthroplasty with probable loosening of implants  Plan   1. I did go over the patient's medication list on today's visit. She is not to take any anti-inflammatory medications 1 week prior to surgery. Otherwise she is not on any anticoagulation medications. Has recently been placed on Crestor. 2. Did discuss postop rehab course 3. Return to clinic 2 weeks postop. Sooner if any problems  This note was generated in part with voice recognition software and I apologize for any typographical errors that were not detected and corrected   Watt Climes PA Electronically signed by Regino Bellow, PA at 06/09/2022 12:09 PM EDT

## 2022-06-16 MED ORDER — LACTATED RINGERS IV SOLN: INTRAVENOUS | Status: DC

## 2022-06-16 MED ORDER — GABAPENTIN 300 MG PO CAPS: 300.0000 mg | ORAL_CAPSULE | Freq: Once | ORAL | Status: AC

## 2022-06-16 MED ORDER — ORAL CARE MOUTH RINSE: 15.0000 mL | Freq: Once | OROMUCOSAL | Status: AC

## 2022-06-16 MED ORDER — DEXAMETHASONE SODIUM PHOSPHATE 10 MG/ML IJ SOLN: 8.0000 mg | Freq: Once | INTRAMUSCULAR | Status: AC

## 2022-06-16 MED ORDER — CELECOXIB 200 MG PO CAPS: 400.0000 mg | ORAL_CAPSULE | Freq: Once | ORAL | Status: AC

## 2022-06-16 MED ORDER — CEFAZOLIN SODIUM-DEXTROSE 2-4 GM/100ML-% IV SOLN
2.0000 g | INTRAVENOUS | Status: AC
  Administered 2022-06-17: 2 g via INTRAVENOUS

## 2022-06-16 MED ORDER — TRANEXAMIC ACID-NACL 1000-0.7 MG/100ML-% IV SOLN
1000.0000 mg | INTRAVENOUS | Status: AC
  Administered 2022-06-17: 1000 mg via INTRAVENOUS

## 2022-06-16 MED ORDER — CHLORHEXIDINE GLUCONATE 0.12 % MT SOLN: 15.0000 mL | Freq: Once | OROMUCOSAL | Status: AC

## 2022-06-16 MED ORDER — CHLORHEXIDINE GLUCONATE 4 % EX LIQD
60.0000 mL | Freq: Once | CUTANEOUS | Status: AC
  Administered 2022-06-17: 4 via TOPICAL

## 2022-06-17 ENCOUNTER — Other Ambulatory Visit: Payer: Self-pay

## 2022-06-17 ENCOUNTER — Inpatient Hospital Stay
Admission: RE | Admit: 2022-06-17 | Discharge: 2022-06-18 | DRG: 468 | Disposition: A | Discharge status: Home Health | Payer: PPO | Attending: Orthopedic Surgery | Admitting: Orthopedic Surgery

## 2022-06-17 ENCOUNTER — Inpatient Hospital Stay: Payer: PPO

## 2022-06-17 ENCOUNTER — Inpatient Hospital Stay: Payer: PPO | Admitting: Urgent Care

## 2022-06-17 ENCOUNTER — Encounter
Admission: RE | Disposition: A | Discharge status: Home Health | Payer: Self-pay | Source: Home / Self Care | Attending: Orthopedic Surgery

## 2022-06-17 ENCOUNTER — Encounter: Payer: Self-pay | Admitting: Orthopedic Surgery

## 2022-06-17 ENCOUNTER — Inpatient Hospital Stay: Payer: PPO | Admitting: Certified Registered"

## 2022-06-17 DIAGNOSIS — Y792 Prosthetic and other implants, materials and accessory orthopedic devices associated with adverse incidents: Secondary | ICD-10-CM | POA: Diagnosis present

## 2022-06-17 DIAGNOSIS — Z79899 Other long term (current) drug therapy: Secondary | ICD-10-CM | POA: Diagnosis not present

## 2022-06-17 DIAGNOSIS — T84038A Mechanical loosening of other internal prosthetic joint, initial encounter: Principal | ICD-10-CM

## 2022-06-17 DIAGNOSIS — K219 Gastro-esophageal reflux disease without esophagitis: Secondary | ICD-10-CM | POA: Diagnosis present

## 2022-06-17 DIAGNOSIS — Z91048 Other nonmedicinal substance allergy status: Secondary | ICD-10-CM | POA: Diagnosis not present

## 2022-06-17 DIAGNOSIS — T84033A Mechanical loosening of internal left knee prosthetic joint, initial encounter: Principal | ICD-10-CM | POA: Diagnosis present

## 2022-06-17 DIAGNOSIS — T84063A Wear of articular bearing surface of internal prosthetic left knee joint, initial encounter: Secondary | ICD-10-CM | POA: Diagnosis present

## 2022-06-17 DIAGNOSIS — R7303 Prediabetes: Secondary | ICD-10-CM

## 2022-06-17 DIAGNOSIS — E785 Hyperlipidemia, unspecified: Secondary | ICD-10-CM | POA: Diagnosis present

## 2022-06-17 DIAGNOSIS — Z96659 Presence of unspecified artificial knee joint: Secondary | ICD-10-CM

## 2022-06-17 DIAGNOSIS — R739 Hyperglycemia, unspecified: Secondary | ICD-10-CM

## 2022-06-17 HISTORY — PX: TOTAL KNEE REVISION: SHX996

## 2022-06-17 LAB — ABO/RH: ABO/RH(D): O POS

## 2022-06-17 SURGERY — TOTAL KNEE REVISION
Anesthesia: Spinal | Site: Knee | Laterality: Left

## 2022-06-17 MED ORDER — BUPIVACAINE HCL (PF) 0.5 % IJ SOLN
INTRAMUSCULAR | Status: AC
  Filled 2022-06-17: qty 10

## 2022-06-17 MED ORDER — PHENYLEPHRINE HCL-NACL 20-0.9 MG/250ML-% IV SOLN
INTRAVENOUS | Status: DC | PRN
  Administered 2022-06-17: 15 ug/min via INTRAVENOUS

## 2022-06-17 MED ORDER — ENOXAPARIN SODIUM 30 MG/0.3ML IJ SOSY
30.0000 mg | PREFILLED_SYRINGE | Freq: Two times a day (BID) | INTRAMUSCULAR | Status: DC
  Administered 2022-06-18: 30 mg via SUBCUTANEOUS
  Filled 2022-06-17: qty 0.3

## 2022-06-17 MED ORDER — BUPIVACAINE HCL (PF) 0.5 % IJ SOLN
INTRAMUSCULAR | Status: DC | PRN
  Administered 2022-06-17: 3 mL

## 2022-06-17 MED ORDER — FLEET ENEMA 7-19 GM/118ML RE ENEM: 1.0000 | ENEMA | Freq: Once | RECTAL | Status: DC | PRN

## 2022-06-17 MED ORDER — CELECOXIB 200 MG PO CAPS
ORAL_CAPSULE | ORAL | Status: AC
  Administered 2022-06-17: 400 mg via ORAL
  Filled 2022-06-17: qty 2

## 2022-06-17 MED ORDER — MENTHOL 3 MG MT LOZG: 1.0000 | LOZENGE | OROMUCOSAL | Status: DC | PRN

## 2022-06-17 MED ORDER — ONDANSETRON HCL 4 MG/2ML IJ SOLN: 4.0000 mg | Freq: Once | INTRAMUSCULAR | Status: DC | PRN

## 2022-06-17 MED ORDER — OXYCODONE HCL 5 MG PO TABS
10.0000 mg | ORAL_TABLET | ORAL | Status: DC | PRN
  Administered 2022-06-17: 10 mg via ORAL
  Filled 2022-06-17: qty 2

## 2022-06-17 MED ORDER — CELECOXIB 200 MG PO CAPS
200.0000 mg | ORAL_CAPSULE | Freq: Two times a day (BID) | ORAL | Status: DC
  Administered 2022-06-17 – 2022-06-18 (×2): 200 mg via ORAL
  Filled 2022-06-17 (×2): qty 1

## 2022-06-17 MED ORDER — METOCLOPRAMIDE HCL 5 MG PO TABS
10.0000 mg | ORAL_TABLET | Freq: Three times a day (TID) | ORAL | Status: DC
  Administered 2022-06-17 – 2022-06-18 (×3): 10 mg via ORAL
  Filled 2022-06-17 (×3): qty 2

## 2022-06-17 MED ORDER — FERROUS SULFATE 325 (65 FE) MG PO TABS
325.0000 mg | ORAL_TABLET | Freq: Two times a day (BID) | ORAL | Status: DC
  Administered 2022-06-18: 325 mg via ORAL
  Filled 2022-06-17: qty 1

## 2022-06-17 MED ORDER — OXYCODONE HCL 5 MG PO TABS: 5.0000 mg | ORAL_TABLET | ORAL | Status: DC | PRN

## 2022-06-17 MED ORDER — HYDROMORPHONE HCL 1 MG/ML IJ SOLN: 0.5000 mg | INTRAMUSCULAR | Status: DC | PRN

## 2022-06-17 MED ORDER — TERBINAFINE HCL 250 MG PO TABS
250.0000 mg | ORAL_TABLET | Freq: Every evening | ORAL | Status: DC
  Administered 2022-06-17: 250 mg via ORAL
  Filled 2022-06-17 (×2): qty 1

## 2022-06-17 MED ORDER — PHENOL 1.4 % MT LIQD: 1.0000 | OROMUCOSAL | Status: DC | PRN

## 2022-06-17 MED ORDER — CEFAZOLIN SODIUM-DEXTROSE 2-4 GM/100ML-% IV SOLN
2.0000 g | Freq: Three times a day (TID) | INTRAVENOUS | Status: AC
  Administered 2022-06-17 – 2022-06-18 (×2): 2 g via INTRAVENOUS
  Filled 2022-06-17 (×2): qty 100

## 2022-06-17 MED ORDER — GABAPENTIN 300 MG PO CAPS
ORAL_CAPSULE | ORAL | Status: AC
  Administered 2022-06-17: 300 mg via ORAL
  Filled 2022-06-17: qty 1

## 2022-06-17 MED ORDER — ONDANSETRON HCL 4 MG/2ML IJ SOLN
4.0000 mg | Freq: Four times a day (QID) | INTRAMUSCULAR | Status: DC | PRN
  Administered 2022-06-17: 4 mg via INTRAVENOUS
  Filled 2022-06-17: qty 2

## 2022-06-17 MED ORDER — TRANEXAMIC ACID-NACL 1000-0.7 MG/100ML-% IV SOLN: 1000.0000 mg | Freq: Once | INTRAVENOUS | Status: AC

## 2022-06-17 MED ORDER — ROSUVASTATIN CALCIUM 10 MG PO TABS
10.0000 mg | ORAL_TABLET | Freq: Every evening | ORAL | Status: DC
  Administered 2022-06-17: 10 mg via ORAL
  Filled 2022-06-17: qty 1

## 2022-06-17 MED ORDER — SENNOSIDES-DOCUSATE SODIUM 8.6-50 MG PO TABS
1.0000 | ORAL_TABLET | Freq: Two times a day (BID) | ORAL | Status: DC
  Administered 2022-06-17 – 2022-06-18 (×2): 1 via ORAL
  Filled 2022-06-17 (×2): qty 1

## 2022-06-17 MED ORDER — ACETAMINOPHEN 10 MG/ML IV SOLN
INTRAVENOUS | Status: DC | PRN
  Administered 2022-06-17: 1000 mg via INTRAVENOUS

## 2022-06-17 MED ORDER — PROPOFOL 500 MG/50ML IV EMUL
INTRAVENOUS | Status: DC | PRN
  Administered 2022-06-17: 50 ug/kg/min via INTRAVENOUS

## 2022-06-17 MED ORDER — ONDANSETRON HCL 4 MG PO TABS: 4.0000 mg | ORAL_TABLET | Freq: Four times a day (QID) | ORAL | Status: DC | PRN

## 2022-06-17 MED ORDER — DIPHENHYDRAMINE HCL 12.5 MG/5ML PO ELIX: 12.5000 mg | ORAL_SOLUTION | ORAL | Status: DC | PRN

## 2022-06-17 MED ORDER — PANTOPRAZOLE SODIUM 40 MG PO TBEC
40.0000 mg | DELAYED_RELEASE_TABLET | Freq: Two times a day (BID) | ORAL | Status: DC
  Administered 2022-06-17 – 2022-06-18 (×2): 40 mg via ORAL
  Filled 2022-06-17 (×2): qty 1

## 2022-06-17 MED ORDER — ACETAMINOPHEN 325 MG PO TABS: 325.0000 mg | ORAL_TABLET | Freq: Four times a day (QID) | ORAL | Status: DC | PRN

## 2022-06-17 MED ORDER — SODIUM CHLORIDE (PF) 0.9 % IJ SOLN
INTRAMUSCULAR | Status: DC | PRN
  Administered 2022-06-17: 120 mL via INTRAMUSCULAR

## 2022-06-17 MED ORDER — ACETAMINOPHEN 10 MG/ML IV SOLN
1000.0000 mg | Freq: Four times a day (QID) | INTRAVENOUS | Status: AC
  Administered 2022-06-17 – 2022-06-18 (×3): 1000 mg via INTRAVENOUS
  Filled 2022-06-17 (×4): qty 100

## 2022-06-17 MED ORDER — SURGIPHOR WOUND IRRIGATION SYSTEM - OPTIME: TOPICAL | Status: DC | PRN

## 2022-06-17 MED ORDER — ACETAMINOPHEN 10 MG/ML IV SOLN
INTRAVENOUS | Status: AC
  Filled 2022-06-17: qty 100

## 2022-06-17 MED ORDER — PROPOFOL 1000 MG/100ML IV EMUL
INTRAVENOUS | Status: AC
  Filled 2022-06-17: qty 100

## 2022-06-17 MED ORDER — FENTANYL CITRATE (PF) 100 MCG/2ML IJ SOLN: 25.0000 ug | INTRAMUSCULAR | Status: DC | PRN

## 2022-06-17 MED ORDER — ENSURE PRE-SURGERY PO LIQD
296.0000 mL | Freq: Once | ORAL | Status: AC
  Administered 2022-06-17: 296 mL via ORAL

## 2022-06-17 MED ORDER — PROPOFOL 10 MG/ML IV BOLUS
INTRAVENOUS | Status: DC | PRN
  Administered 2022-06-17: 30 mg via INTRAVENOUS

## 2022-06-17 MED ORDER — SODIUM CHLORIDE 0.9 % IR SOLN
Status: DC | PRN
  Administered 2022-06-17: 3000 mL

## 2022-06-17 MED ORDER — TRANEXAMIC ACID-NACL 1000-0.7 MG/100ML-% IV SOLN
INTRAVENOUS | Status: AC
  Filled 2022-06-17: qty 100

## 2022-06-17 MED ORDER — ALUM & MAG HYDROXIDE-SIMETH 200-200-20 MG/5ML PO SUSP: 30.0000 mL | ORAL | Status: DC | PRN

## 2022-06-17 MED ORDER — DEXAMETHASONE SODIUM PHOSPHATE 10 MG/ML IJ SOLN
INTRAMUSCULAR | Status: AC
  Administered 2022-06-17: 8 mg via INTRAVENOUS
  Filled 2022-06-17: qty 1

## 2022-06-17 MED ORDER — TRANEXAMIC ACID-NACL 1000-0.7 MG/100ML-% IV SOLN
INTRAVENOUS | Status: AC
  Administered 2022-06-17: 1000 mg via INTRAVENOUS
  Filled 2022-06-17: qty 100

## 2022-06-17 MED ORDER — BISACODYL 10 MG RE SUPP: 10.0000 mg | Freq: Every day | RECTAL | Status: DC | PRN

## 2022-06-17 MED ORDER — MAGNESIUM HYDROXIDE 400 MG/5ML PO SUSP
30.0000 mL | Freq: Every day | ORAL | Status: DC
  Administered 2022-06-17 – 2022-06-18 (×2): 30 mL via ORAL
  Filled 2022-06-17 (×2): qty 30

## 2022-06-17 MED ORDER — CHLORHEXIDINE GLUCONATE 0.12 % MT SOLN
OROMUCOSAL | Status: AC
  Administered 2022-06-17: 15 mL via OROMUCOSAL
  Filled 2022-06-17: qty 15

## 2022-06-17 MED ORDER — MIDAZOLAM HCL 5 MG/5ML IJ SOLN
INTRAMUSCULAR | Status: DC | PRN
  Administered 2022-06-17: 2 mg via INTRAVENOUS

## 2022-06-17 MED ORDER — TRAMADOL HCL 50 MG PO TABS
50.0000 mg | ORAL_TABLET | ORAL | Status: DC | PRN
  Administered 2022-06-17 – 2022-06-18 (×2): 100 mg via ORAL
  Filled 2022-06-17 (×2): qty 2

## 2022-06-17 MED ORDER — MIDAZOLAM HCL 2 MG/2ML IJ SOLN
INTRAMUSCULAR | Status: AC
  Filled 2022-06-17: qty 2

## 2022-06-17 MED ORDER — CEFAZOLIN SODIUM-DEXTROSE 2-4 GM/100ML-% IV SOLN
INTRAVENOUS | Status: AC
  Filled 2022-06-17: qty 100

## 2022-06-17 MED ORDER — SODIUM CHLORIDE 0.9 % IV SOLN: INTRAVENOUS | Status: DC

## 2022-06-17 SURGICAL SUPPLY — 90 items
ATTUNE PSFEM LTSZ4 NARCEM KNEE (Femur) IMPLANT
ATTUNE PSRP INSR SZ4 12 KNEE (Insert) IMPLANT
BASE TIBIAL ROT PLAT SZ 3 KNEE (Knees) IMPLANT
BATTERY INSTRU NAVIGATION (MISCELLANEOUS) IMPLANT
BLADE OSCILLATING/SAGITTAL (BLADE) ×1
BLADE SAGITTAL 25.0X1.19X90 (BLADE) ×1 IMPLANT
BLADE SAW 70X12.5 (BLADE) ×1 IMPLANT
BLADE SAW 90X13X1.19 OSCILLAT (BLADE) ×1 IMPLANT
BLADE SAW 90X25X1.19 OSCILLAT (BLADE) ×1 IMPLANT
BLADE SAW SGTL 13X75X1.27 (BLADE) ×1 IMPLANT
BLADE SW THK.38XMED LNG THN (BLADE) ×1 IMPLANT
BONE CEMENT GENTAMICIN (Cement) ×2 IMPLANT
CEMENT BONE GENTAMICIN 40 (Cement) ×2 IMPLANT
CNTNR SPEC 2.5X3XGRAD LEK (MISCELLANEOUS)
CONT SPEC 4OZ STER OR WHT (MISCELLANEOUS)
CONTAINER SPEC 2.5X3XGRAD LEK (MISCELLANEOUS) IMPLANT
COOLER POLAR GLACIER W/PUMP (MISCELLANEOUS) ×1 IMPLANT
COVER BACK TABLE REUSABLE LG (DRAPES) ×1 IMPLANT
CUFF TOURN SGL QUICK 24 (TOURNIQUET CUFF)
CUFF TOURN SGL QUICK 34 (TOURNIQUET CUFF)
CUFF TRNQT CYL 24X4X16.5-23 (TOURNIQUET CUFF) IMPLANT
CUFF TRNQT CYL 34X4.125X (TOURNIQUET CUFF) IMPLANT
DRAPE 3/4 80X56 (DRAPES) ×2 IMPLANT
DRSG DERMACEA NONADH 3X8 (GAUZE/BANDAGES/DRESSINGS) ×1 IMPLANT
DRSG MEPILEX SACRM 8.7X9.8 (GAUZE/BANDAGES/DRESSINGS) ×1 IMPLANT
DRSG OPSITE POSTOP 4X14 (GAUZE/BANDAGES/DRESSINGS) ×1 IMPLANT
DRSG TEGADERM 4X4.75 (GAUZE/BANDAGES/DRESSINGS) ×1 IMPLANT
DURAPREP 26ML APPLICATOR (WOUND CARE) ×2 IMPLANT
ELECT CAUTERY BLADE 6.4 (BLADE) ×1 IMPLANT
ELECT REM PT RETURN 9FT ADLT (ELECTROSURGICAL) ×1
ELECTRODE REM PT RTRN 9FT ADLT (ELECTROSURGICAL) ×1 IMPLANT
EX-PIN ORTHOLOCK NAV 4X150 (PIN) IMPLANT
GAUZE SPONGE 4X4 12PLY STRL (GAUZE/BANDAGES/DRESSINGS) ×1 IMPLANT
GLOVE BIOGEL M STRL SZ7.5 (GLOVE) ×5 IMPLANT
GLOVE BIOGEL PI IND STRL 8 (GLOVE) ×1 IMPLANT
GLOVE PI ORTHO PRO STRL 7.5 (GLOVE) ×2 IMPLANT
GLOVE SURG UNDER LTX SZ8 (GLOVE) ×1 IMPLANT
GLOVE SURG UNDER POLY LF SZ7.5 (GLOVE) ×1 IMPLANT
GOWN STRL REUS W/ TWL LRG LVL3 (GOWN DISPOSABLE) ×2 IMPLANT
GOWN STRL REUS W/TWL LRG LVL3 (GOWN DISPOSABLE) ×2
HANDPIECE VERSAJET DEBRIDEMENT (MISCELLANEOUS) IMPLANT
HEMOVAC 400CC 10FR (MISCELLANEOUS) ×1 IMPLANT
HOLDER FOLEY CATH W/STRAP (MISCELLANEOUS) ×1 IMPLANT
HOOD PEEL AWAY FLYTE STAYCOOL (MISCELLANEOUS) ×2 IMPLANT
IRRIGATION SURGIPHOR STRL (IV SOLUTION) ×1 IMPLANT
IV NS IRRIG 3000ML ARTHROMATIC (IV SOLUTION) ×1 IMPLANT
KIT TURNOVER KIT A (KITS) ×1 IMPLANT
KNIFE SCULPS 14X20 (INSTRUMENTS) ×1 IMPLANT
MANIFOLD NEPTUNE II (INSTRUMENTS) ×2 IMPLANT
NDL SAFETY ECLIP 18X1.5 (MISCELLANEOUS) ×1 IMPLANT
NDL SPNL 18GX3.5 QUINCKE PK (NEEDLE) ×1 IMPLANT
NDL SPNL 20GX3.5 QUINCKE YW (NEEDLE) ×2 IMPLANT
NEEDLE SPNL 18GX3.5 QUINCKE PK (NEEDLE) ×1 IMPLANT
NEEDLE SPNL 20GX3.5 QUINCKE YW (NEEDLE) ×2 IMPLANT
NS IRRIG 1000ML POUR BTL (IV SOLUTION) ×1 IMPLANT
OSTEOTOME THIN 10.0 1.5 (INSTRUMENTS) IMPLANT
PACK TOTAL KNEE (MISCELLANEOUS) ×1 IMPLANT
PAD ABD DERMACEA PRESS 5X9 (GAUZE/BANDAGES/DRESSINGS) ×2 IMPLANT
PAD WRAPON POLAR KNEE (MISCELLANEOUS) ×1 IMPLANT
PATELLA MEDIAL ATTUN 35MM KNEE (Knees) IMPLANT
PENCIL SMOKE EVACUATOR COATED (MISCELLANEOUS) IMPLANT
PIN FIXATION 1/8DIA X 3INL (PIN) IMPLANT
PULSAVAC PLUS IRRIG FAN TIP (DISPOSABLE) ×1
SOL PREP PVP 2OZ (MISCELLANEOUS) ×1
SOLUTION PREP PVP 2OZ (MISCELLANEOUS) ×1 IMPLANT
SPONGE DRAIN TRACH 4X4 STRL 2S (GAUZE/BANDAGES/DRESSINGS) ×1 IMPLANT
SPONGE T-LAP 18X18 ~~LOC~~+RFID (SPONGE) IMPLANT
STAPLER SKIN PROX 35W (STAPLE) ×1 IMPLANT
STOCKINETTE IMPERV 14X48 (MISCELLANEOUS) IMPLANT
SUCTION FRAZIER HANDLE 10FR (MISCELLANEOUS) ×1
SUCTION TUBE FRAZIER 10FR DISP (MISCELLANEOUS) ×1 IMPLANT
SUT MNCRL 0 1X36 CT-1 (SUTURE) ×1 IMPLANT
SUT MON AB 2-0 CT1 36 (SUTURE) ×1 IMPLANT
SUT MONOCRYL 0 (SUTURE)
SUT PROLENE 1 CT 1 30 (SUTURE) ×1 IMPLANT
SUT VIC AB 0 CT1 36 (SUTURE) ×1 IMPLANT
SUT VIC AB 1 CT1 36 (SUTURE) ×2 IMPLANT
SUT VIC AB 2-0 CT1 (SUTURE) ×1 IMPLANT
SWAB CULTURE AMIES ANAERIB BLU (MISCELLANEOUS) IMPLANT
SYR 20ML LL LF (SYRINGE) ×1 IMPLANT
SYR 30ML LL (SYRINGE) ×2 IMPLANT
TIBIAL BASE ROT PLAT SZ 3 KNEE (Knees) ×1 IMPLANT
TIP BRUSH PULSAVAC PLUS 24.33 (MISCELLANEOUS) ×1 IMPLANT
TIP FAN IRRIG PULSAVAC PLUS (DISPOSABLE) ×1 IMPLANT
TOWEL OR 17X26 4PK STRL BLUE (TOWEL DISPOSABLE) ×1 IMPLANT
TOWER CARTRIDGE SMART MIX (DISPOSABLE) ×1 IMPLANT
TRAP FLUID SMOKE EVACUATOR (MISCELLANEOUS) ×1 IMPLANT
TRAY FOLEY MTR SLVR 16FR STAT (SET/KITS/TRAYS/PACK) ×1 IMPLANT
WATER STERILE IRR 1000ML POUR (IV SOLUTION) ×1 IMPLANT
WRAPON POLAR PAD KNEE (MISCELLANEOUS) ×1

## 2022-06-17 NOTE — Interval H&P Note (Signed)
History and Physical Interval Note:  06/17/2022 11:34 AM  Ellen Montoya  has presented today for surgery, with the diagnosis of Status post left partial knee replacement Z96.652 Loosening of knee joint prosthesis, initial encounter CMS-HCC T84.038A, Z96.659.  The various methods of treatment have been discussed with the patient and family. After consideration of risks, benefits and other options for treatment, the patient has consented to  Procedure(s) with comments: CONVERSION OF UNICOMPARTMENTAL ARTHROPLASTY TO LEFT TOTAL KNEE ARTHROPLASTY (Left) - RNFA as a surgical intervention.  The patient's history has been reviewed, patient examined, no change in status, stable for surgery.  I have reviewed the patient's chart and labs.  Questions were answered to the patient's satisfaction.     [place]

## 2022-06-17 NOTE — Anesthesia Preprocedure Evaluation (Signed)
Anesthesia Evaluation  Patient identified by MRN, date of birth, ID band Patient awake    Reviewed: Allergy & Precautions, H&P , NPO status , Patient's Chart, lab work & pertinent test results, reviewed documented beta blocker date and time   History of Anesthesia Complications (+) PONV and history of anesthetic complications  Airway Mallampati: III  TM Distance: >3 FB Neck ROM: full    Dental  (+) Dental Advidsory Given, Caps, Implants   Pulmonary neg pulmonary ROS,    Pulmonary exam normal breath sounds clear to auscultation       Cardiovascular Exercise Tolerance: Good negative cardio ROS Normal cardiovascular exam Rhythm:regular Rate:Normal     Neuro/Psych negative neurological ROS  negative psych ROS   GI/Hepatic Neg liver ROS, GERD  ,  Endo/Other  negative endocrine ROS  Renal/GU negative Renal ROS  negative genitourinary   Musculoskeletal   Abdominal   Peds  Hematology negative hematology ROS (+)   Anesthesia Other Findings Past Medical History: 09/08/2019: Acquired trigger finger 09/08/2019: Acute meniscal tear, medial No date: Allergy No date: Arthritis 09/08/2019: Cyst of tendon sheath No date: GERD (gastroesophageal reflux disease) No date: HLD (hyperlipidemia) No date: PONV (postoperative nausea and vomiting) 12/17/2016: Routine physical examination   Reproductive/Obstetrics negative OB ROS                             Anesthesia Physical Anesthesia Plan  ASA: 2  Anesthesia Plan: Spinal   Post-op Pain Management:    Induction:   PONV Risk Score and Plan: 3 and Propofol infusion and TIVA  Airway Management Planned: Natural Airway and Simple Face Mask  Additional Equipment:   Intra-op Plan:   Post-operative Plan:   Informed Consent: I have reviewed the patients History and Physical, chart, labs and discussed the procedure including the risks, benefits and  alternatives for the proposed anesthesia with the patient or authorized representative who has indicated his/her understanding and acceptance.     Dental Advisory Given  Plan Discussed with: Anesthesiologist, CRNA and Surgeon  Anesthesia Plan Comments:         Anesthesia Quick Evaluation

## 2022-06-17 NOTE — Anesthesia Procedure Notes (Signed)
Spinal  Patient location during procedure: OR Start time: 06/17/2022 11:50 AM End time: 06/17/2022 11:58 AM Reason for block: surgical anesthesia Staffing Performed: resident/CRNA  Anesthesiologist: Martha Clan, MD Resident/CRNA: Fredderick Phenix, CRNA Performed by: Fredderick Phenix, CRNA Authorized by: Martha Clan, MD   Preanesthetic Checklist Completed: patient identified, IV checked, site marked, risks and benefits discussed, surgical consent, monitors and equipment checked, pre-op evaluation and timeout performed Spinal Block Patient position: sitting Prep: DuraPrep Patient monitoring: heart rate, cardiac monitor, continuous pulse ox and blood pressure Approach: midline Location: L3-4 Injection technique: single-shot Needle Needle type: Sprotte  Needle gauge: 24 G Needle length: 9 cm Assessment Sensory level: T4 Events: CSF return

## 2022-06-17 NOTE — Op Note (Signed)
OPERATIVE NOTE  DATE OF SURGERY:  06/17/2022  PATIENT NAME:  Ellen Montoya   DOB: 1956-02-13  MRN: [id number]  PRE-OPERATIVE DIAGNOSIS: Loose implants status post left unicompartmental knee arthroplasty  POST-OPERATIVE DIAGNOSIS:  Same  PROCEDURE: Conversion of a left unicompartmental knee arthroplasty to a left total knee arthroplasty using computer-assisted navigation (Removal of unicompartmental knee implants followed by total knee arthroplasty)  SURGEON:  Marciano Sequin. M.D.  ANESTHESIA: spinal  ESTIMATED BLOOD LOSS: 50 mL  FLUIDS REPLACED: 1400 mL of crystalloid  TOURNIQUET TIME: 130 minutes  DRAINS: 2 medium Hemovac drains  SOFT TISSUE RELEASES: Anterior cruciate ligament, posterior cruciate ligament, deep medial collateral ligament, patellofemoral ligament  IMPLANTS UTILIZED: DePuy Attune size 4N posterior stabilized femoral component (cemented), size 3 rotating platform tibial component (cemented), 35 mm medialized dome patella (cemented), and a 12 mm stabilized rotating platform polyethylene insert.  INDICATIONS FOR SURGERY: Ellen Montoya is a 66 y.o. year old female who had a previous left unicompartmental knee arthroplasty as per an outside physician.  She was seen for complaints of swelling and pain to the left knee and radiographs demonstrated some radiolucency suggestive of implant loosening. The patient had not seen any significant improvement despite conservative nonsurgical intervention. After discussion of the risks and benefits of surgical intervention, the patient expressed understanding of the risks benefits and agree with plans for conversion of the left unicompartmental knee arthroplasty to a total knee arthroplasty.   The risks, benefits, and alternatives were discussed at length including but not limited to the risks of infection, bleeding, nerve injury, stiffness, blood clots, the need for revision surgery, cardiopulmonary complications, among others,  and they were willing to proceed.  PROCEDURE IN DETAIL: The patient was brought into the operating room and, after adequate spinal anesthesia was achieved, a tourniquet was placed on the patient's upper thigh. The patient's knee and leg were cleaned and prepped with alcohol and DuraPrep and draped in the usual sterile fashion. A "timeout" was performed as per usual protocol. The lower extremity was exsanguinated using an Esmarch, and the tourniquet was inflated to 300 mmHg. An anterior longitudinal incision was made in line with the previous surgical scar followed by a standard medial parapatellar approach.  Fobs were obtained of the synovial fluid and submitted for stat Gram stain, culture and sensitivity.  The deep fibers of the medial collateral ligament were elevated in a subperiosteal fashion off of the medial flare of the tibia so as to maintain a continuous soft tissue sleeve. The patella was subluxed laterally and the patellofemoral ligament was incised.  Extensive synovectomy was performed so as to reestablish the medial and lateral gutters.  Inspection of the knee demonstrated severe degenerative changes with full-thickness loss of articular cartilage in the patellofemoral and lateral compartments.  Inspection of the medial unicompartmental implants demonstrated significant polyethylene wear.  Curette and rondure was used to debride along the margin of the tibial implant and the tibial implant was easily removed by levering with a narrow osteotome.  There was no significant loss of bone.  Next, attention was directed to the femoral implant.  The femoral implant was grossly loose and could be removed by hand.  Osteophytes were debrided using a rongeur. Anterior and posterior cruciate ligaments were excised. Two 4.0 mm Schanz pins were inserted in the femur and into the tibia for attachment of the array of trackers used for computer-assisted navigation. Hip center was identified using a circumduction  technique. Distal landmarks were mapped using the  computer. The distal femur and proximal tibia were mapped using the computer. The distal femoral cutting guide was positioned using computer-assisted navigation so as to achieve a 5 distal valgus cut. The femur was sized and it was felt that a size 4N femoral component was appropriate. A size 4 femoral cutting guide was positioned and the anterior cut was performed and verified using the computer. This was followed by completion of the posterior and chamfer cuts. Femoral cutting guide for the central box was then positioned in the center box cut was performed.  Attention was then directed to the proximal tibia. Medial and lateral menisci were excised. The extramedullary tibial cutting guide was positioned using computer-assisted navigation so as to achieve a 0 varus-valgus alignment and 3 posterior slope. The cut was performed and verified using the computer. The proximal tibia was sized and it was felt that a size 3 tibial tray was appropriate. Tibial and femoral trials were inserted followed by insertion of a 12 mm polyethylene insert. This allowed for excellent mediolateral soft tissue balancing both in flexion and in full extension. Finally, the patella was cut and prepared so as to accommodate a 35 mm medialized dome patella. A patella trial was placed and the knee was placed through a range of motion with excellent patellar tracking appreciated. The femoral trial was removed after debridement of posterior osteophytes. The central post-hole for the tibial component was reamed followed by insertion of a keel punch. Tibial trials were then removed. Cut surfaces of bone were irrigated with copious amounts of normal saline using pulsatile lavage and then suctioned dry. Polymethylmethacrylate cement with gentamicin was prepared in the usual fashion using a vacuum mixer. Cement was applied to the cut surface of the proximal tibia as well as along the undersurface  of a size 3 rotating platform tibial component. Tibial component was positioned and impacted into place. Excess cement was removed using [employer] fast streamer. Cement was then applied to the cut surfaces of the femur as well as along the posterior flanges of the size 4N femoral component. The femoral component was positioned and impacted into place. Excess cement was removed using [employer] fast streamer. A 12 mm polyethylene trial was inserted and the knee was brought into full extension with steady axial compression applied. Finally, cement was applied to the backside of a 35 mm medialized dome patella and the patellar component was positioned and patellar clamp applied. Excess cement was removed using [employer] fast streamer. After adequate curing of the cement, the tourniquet was deflated after a total tourniquet time of 130 minutes. Hemostasis was achieved using electrocautery. The knee was irrigated with copious amounts of normal saline using pulsatile lavage followed by 450 ml of Surgiphor and then suctioned dry. 20 mL of 1.3% Exparel and 60 mL of 0.25% Marcaine in 40 mL of normal saline was injected along the posterior capsule, medial and lateral gutters, and along the arthrotomy site. A 12 mm stabilized rotating platform polyethylene insert was inserted and the knee was placed through a range of motion with excellent mediolateral soft tissue balancing appreciated and excellent patellar tracking noted. 2 medium drains were placed in the wound bed and brought out through separate stab incisions. The medial parapatellar incision was reapproximated using interrupted sutures of #1 Vicryl. Subcutaneous tissue was approximated in layers using first #0 Vicryl followed #2-0 Vicryl. The skin was approximated with skin staples. A sterile dressing was applied.  The patient tolerated the procedure well and was transported to the recovery room in stable condition.  Adelard Sanon P. Holley Bouche., M.D.

## 2022-06-17 NOTE — Transfer of Care (Signed)
Immediate Anesthesia Transfer of Care Note  Patient: Ellen Montoya  Procedure(s) Performed: CONVERSION OF UNICOMPARTMENTAL ARTHROPLASTY TO LEFT TOTAL KNEE ARTHROPLASTY (Left: Knee)  Patient Location: PACU  Anesthesia Type:General  Level of Consciousness: drowsy  Airway & Oxygen Therapy: Patient Spontanous Breathing and Patient connected to face mask oxygen  Post-op Assessment: Report given to RN and Post -op Vital signs reviewed and stable  Post vital signs: Reviewed and stable  Last Vitals:  Vitals Value Taken Time  BP 111/70 06/17/22 1615  Temp 36.6 C 06/17/22 1615  Pulse 105 06/17/22 1621  Resp 17 06/17/22 1621  SpO2 93 % 06/17/22 1621  Vitals shown include unvalidated device data.  Last Pain:  Vitals:   06/17/22 1615  TempSrc:   PainSc: Asleep         Complications: No notable events documented.

## 2022-06-18 ENCOUNTER — Encounter: Payer: Self-pay | Admitting: Orthopedic Surgery

## 2022-06-18 MED ORDER — ENOXAPARIN SODIUM 40 MG/0.4ML IJ SOSY: 40.0000 mg | PREFILLED_SYRINGE | INTRAMUSCULAR | 0 refills | Status: DC

## 2022-06-18 MED ORDER — CELECOXIB 200 MG PO CAPS: 200.0000 mg | ORAL_CAPSULE | Freq: Two times a day (BID) | ORAL | 0 refills | Status: AC

## 2022-06-18 MED ORDER — TRAMADOL HCL 50 MG PO TABS: 50.0000 mg | ORAL_TABLET | ORAL | 0 refills | Status: DC | PRN

## 2022-06-18 MED ORDER — ACETAMINOPHEN 325 MG PO TABS: 325.0000 mg | ORAL_TABLET | Freq: Four times a day (QID) | ORAL | Status: DC | PRN

## 2022-06-18 MED ORDER — OXYCODONE HCL 5 MG PO TABS: 5.0000 mg | ORAL_TABLET | ORAL | 0 refills | Status: DC | PRN

## 2022-06-18 MED ORDER — ONDANSETRON HCL 4 MG PO TABS: 4.0000 mg | ORAL_TABLET | Freq: Four times a day (QID) | ORAL | 0 refills | Status: DC | PRN

## 2022-06-18 MED ORDER — SENNOSIDES-DOCUSATE SODIUM 8.6-50 MG PO TABS: 1.0000 | ORAL_TABLET | Freq: Two times a day (BID) | ORAL | 0 refills | Status: DC

## 2022-06-18 NOTE — Plan of Care (Signed)
Patient sleeping between care. Aox4. Pain controlled. Surgical dressing C/D/I. Hemovac intact. Mild nausea present overnight. Plan of care reviewed with patient. Call bell within reach.   Problem: Education: Goal: Knowledge of the prescribed therapeutic regimen will improve Outcome: Progressing Goal: Individualized Educational Video(s) Outcome: Progressing   Problem: Activity: Goal: Ability to avoid complications of mobility impairment will improve Outcome: Progressing Goal: Range of joint motion will improve Outcome: Progressing   Problem: Clinical Measurements: Goal: Postoperative complications will be avoided or minimized Outcome: Progressing   Problem: Pain Management: Goal: Pain level will decrease with appropriate interventions Outcome: Progressing   Problem: Skin Integrity: Goal: Will show signs of wound healing Outcome: Progressing   Problem: Education: Goal: Knowledge of General Education information will improve Description: Including pain rating scale, medication(s)/side effects and non-pharmacologic comfort measures Outcome: Progressing   Problem: Health Behavior/Discharge Planning: Goal: Ability to manage health-related needs will improve Outcome: Progressing   Problem: Clinical Measurements: Goal: Ability to maintain clinical measurements within normal limits will improve Outcome: Progressing Goal: Will remain free from infection Outcome: Progressing Goal: Diagnostic test results will improve Outcome: Progressing Goal: Respiratory complications will improve Outcome: Progressing Goal: Cardiovascular complication will be avoided Outcome: Progressing   Problem: Activity: Goal: Risk for activity intolerance will decrease Outcome: Progressing   Problem: Nutrition: Goal: Adequate nutrition will be maintained Outcome: Progressing   Problem: Coping: Goal: Level of anxiety will decrease Outcome: Progressing   Problem: Elimination: Goal: Will not  experience complications related to bowel motility Outcome: Progressing Goal: Will not experience complications related to urinary retention Outcome: Progressing   Problem: Pain Managment: Goal: General experience of comfort will improve Outcome: Progressing   Problem: Safety: Goal: Ability to remain free from injury will improve Outcome: Progressing   Problem: Skin Integrity: Goal: Risk for impaired skin integrity will decrease Outcome: Progressing

## 2022-06-18 NOTE — Progress Notes (Signed)
Spoke with the patient She lives at home with her husband, her daughter will stay with her for 1 week, she needs a rolling walker and a 3 in 1, [employer] to deliver to the room Wink is set up for [facility] services Her daughter to provide transportation

## 2022-06-18 NOTE — Evaluation (Signed)
Occupational Therapy Evaluation Patient Details Name: Ellen Montoya MRN: [id number] DOB: 1956-05-03 Today's Date: 06/18/2022   History of Present Illness Ellen Montoya is a 66 y.o. with PMH of OA. Pt s/p conversion of a partial total knee to a total arthroplasty on 06/17/2022   Clinical Impression   Ellen Montoya was seen for OT evaluation this date. Prior to hospital admission, pt was Independent for mobility and ADLs. Pt lives with husband, plan for daughter to stay one week. Pt presents to acute OT demonstrating impaired ADL performance and functional mobility 2/2 decreased activity tolerance. Pt currently requires SUPERVISION + RW toilet t/f, pericare lateral leans, and hand washing stnading sinkside. MOD I don/doff B socks in sitting. Pt instructed in polar care mgt, falls prevention strategies, DME/AE for LB bathing/dressing tasks, and compression stocking mgt. All education complete, no OT needs identified, will sign off. Upon hospital discharge, recommend no OT follow up.    Recommendations for follow up therapy are one component of a multi-disciplinary discharge planning process, led by the attending physician.  Recommendations may be updated based on patient status, additional functional criteria and insurance authorization.   Follow Up Recommendations  No OT follow up    Assistance Recommended at Discharge Set up Supervision/Assistance  Patient can return home with the following A little help with walking and/or transfers;A little help with bathing/dressing/bathroom    Functional Status Assessment  Patient has had a recent decline in their functional status and demonstrates the ability to make significant improvements in function in a reasonable and predictable amount of time.  Equipment Recommendations  Other (comment) (2WW)    Recommendations for Other Services       Precautions / Restrictions Precautions Precautions: Fall Restrictions Weight Bearing Restrictions: Yes LLE  Weight Bearing: Weight bearing as tolerated      Mobility Bed Mobility               General bed mobility comments: recieved and left sitting    Transfers Overall transfer level: Needs assistance Equipment used: None Transfers: Sit to/from Stand Sit to Stand: Supervision           General transfer comment: rises easily however unable to weight shift without UE support      Balance Overall balance assessment: Needs assistance Sitting-balance support: No upper extremity supported, Feet supported Sitting balance-Leahy Scale: Normal     Standing balance support: No upper extremity supported, During functional activity Standing balance-Leahy Scale: Good                             ADL either performed or assessed with clinical judgement   ADL Overall ADL's : Needs assistance/impaired                                       General ADL Comments: SUPERVISION + RW toilet t/f, pericare lateral leans, and hand washing stnading sinkside. MOD I don/doff B socks in sitting.      Pertinent Vitals/Pain Pain Assessment Pain Assessment: 0-10 Pain Score: 3  Pain Location: L knee with flexion Pain Descriptors / Indicators: Discomfort, Dull Pain Intervention(s): Limited activity within patient's tolerance, Repositioned     Hand Dominance Right   Extremity/Trunk Assessment Upper Extremity Assessment Upper Extremity Assessment: Overall WFL for tasks assessed   Lower Extremity Assessment Lower Extremity Assessment: Defer to PT evaluation  Communication Communication Communication: No difficulties   Cognition Arousal/Alertness: Awake/alert Behavior During Therapy: WFL for tasks assessed/performed Overall Cognitive Status: Within Functional Limits for tasks assessed                                        Home Living Family/patient expects to be discharged to:: Private residence Living Arrangements: Spouse/significant  other Available Help at Discharge: Family;Available 24 hours/day Type of Home: House Home Access: Stairs to enter [employer] of Steps: 5+3 front, 2+3 side   Home Layout: Two level;Able to live on main level with bedroom/bathroom               Home Equipment: None          Prior Functioning/Environment Prior Level of Function : Independent/Modified Independent                        OT Problem List: Decreased strength;Decreased activity tolerance         OT Goals(Current goals can be found in the care plan section) Acute Rehab OT Goals Patient Stated Goal: to go home OT Goal Formulation: With patient Time For Goal Achievement: 07/02/22 Potential to Achieve Goals: Good   AM-PAC OT "6 Clicks" Daily Activity     Outcome Measure Help from another person eating meals?: None Help from another person taking care of personal grooming?: None Help from another person toileting, which includes using toliet, bedpan, or urinal?: A Little Help from another person bathing (including washing, rinsing, drying)?: A Little Help from another person to put on and taking off regular upper body clothing?: None Help from another person to put on and taking off regular lower body clothing?: None 6 Click Score: 22   End of Session    Activity Tolerance: Patient tolerated treatment well Patient left: in chair;with call bell/phone within reach  OT Visit Diagnosis: Muscle weakness (generalized) (M62.81)                Time: 7017-7939 OT Time Calculation (min): 17 min Charges:  OT General Charges $OT Visit: 1 Visit OT Evaluation $OT Eval Low Complexity: 1 Low  Dessie Coma, M.S. OTR/L  06/18/22, 9:15 AM  ascom [phone]

## 2022-06-18 NOTE — Discharge Instructions (Signed)
°  Instructions after Total Knee Replacement ° ° James P. Hooten, Jr., M.D.    ° Dept. of Orthopaedics & Sports Medicine ° [facility] ° [address] ° [place], [place]  [address] ° Phone: [phone]   Fax: [phone] ° °  °DIET: °• Drink plenty of non-alcoholic fluids. °• Resume your normal diet. Include foods high in fiber. ° °ACTIVITY:  °• You may use crutches or a walker with weight-bearing as tolerated, unless instructed otherwise. °• You may be weaned off of the walker or crutches by your Physical Therapist.  °• Do NOT place pillows under the knee. Anything placed under the knee could limit your ability to straighten the knee.   °• Continue doing gentle exercises. Exercising will reduce the pain and swelling, increase motion, and prevent muscle weakness.   °• Please continue to use the TED compression stockings for 6 weeks. You may remove the stockings at night, but should reapply them in the morning. °• Do not drive or operate any equipment until instructed. ° °WOUND CARE:  °• Continue to use the PolarCare or ice packs periodically to reduce pain and swelling. °• You may bathe or shower after the staples are removed at the first office visit following surgery. ° °MEDICATIONS: °• You may resume your regular medications. °• Please take the pain medication as prescribed on the medication. °• Do not take pain medication on an empty stomach. °• You have been given a prescription for a blood thinner (Lovenox or Coumadin). Please take the medication as instructed. (NOTE: After completing a 2 week course of Lovenox, take one Enteric-coated aspirin once a day. This along with elevation will help reduce the possibility of phlebitis in your operated leg.) °• Do not drive or drink alcoholic beverages when taking pain medications. ° °CALL THE OFFICE FOR: °• Temperature above 101 degrees °• Excessive bleeding or drainage on the dressing. °• Excessive swelling, coldness, or paleness of the toes. °• Persistent  nausea and vomiting. ° °FOLLOW-UP:  °• You should have an appointment to return to the office in 10-14 days after surgery. °• Arrangements have been made for continuation of Physical Therapy (either home therapy or outpatient therapy). °  °

## 2022-06-18 NOTE — Progress Notes (Signed)
Patient is not able to walk the distance required to go the bathroom, or he/she is unable to safely negotiate stairs required to access the bathroom.  A 3in1 BSC will alleviate this problem  

## 2022-06-18 NOTE — Evaluation (Signed)
Physical Therapy Evaluation Patient Details Name: JANISHA BUESO MRN: [id number] DOB: November 26, 1955 Today's Date: 06/18/2022  History of Present Illness  VALISSA LYVERS is a 66 y.o. with PMH of OA. Pt s/p conversion of a partial total knee to a total arthroplasty on 06/17/2022  Clinical Impression  Patient received in recliner. She reports no pain at rest. Minimal pain with exercises, ROM. Patient is motivated and pleasant. Transfers with mod I. Ambulated 120 feet with RW and min guard. Instructed in HEP. Patient will continue to benefit from skilled PT while here to improve functional independence, rom and strength for safe return home.          Recommendations for follow up therapy are one component of a multi-disciplinary discharge planning process, led by the attending physician.  Recommendations may be updated based on patient status, additional functional criteria and insurance authorization.  Follow Up Recommendations Home health PT      Assistance Recommended at Discharge Intermittent Supervision/Assistance  Patient can return home with the following  A little help with walking and/or transfers;A little help with bathing/dressing/bathroom;Assist for transportation;Assistance with cooking/housework;Help with stairs or ramp for entrance    Equipment Recommendations Rolling walker (2 wheels)  Recommendations for Other Services       Functional Status Assessment Patient has had a recent decline in their functional status and demonstrates the ability to make significant improvements in function in a reasonable and predictable amount of time.     Precautions / Restrictions Precautions Precautions: Fall Restrictions Weight Bearing Restrictions: Yes LLE Weight Bearing: Weight bearing as tolerated      Mobility  Bed Mobility               General bed mobility comments: patient in recliner    Transfers Overall transfer level: Modified independent Equipment used: Rolling  walker (2 wheels) Transfers: Sit to/from Stand Sit to Stand: Modified independent (Device/Increase time)                Ambulation/Gait Ambulation/Gait assistance: Min guard Gait Distance (Feet): 120 Feet Assistive device: Rolling walker (2 wheels) Gait Pattern/deviations: Step-through pattern, Decreased step length - right, Decreased step length - left, Decreased stride length Gait velocity: decr     General Gait Details: generally safe, reciprocal pattern.  Stairs            Wheelchair Mobility    Modified Rankin (Stroke Patients Only)       Balance Overall balance assessment: Modified Independent Sitting-balance support: Feet supported Sitting balance-Leahy Scale: Normal     Standing balance support: Bilateral upper extremity supported, During functional activity, Reliant on assistive device for balance Standing balance-Leahy Scale: Good                               Pertinent Vitals/Pain Pain Assessment Pain Assessment: Faces Faces Pain Scale: Hurts a little bit Pain Location: L knee with flexion Pain Descriptors / Indicators: Discomfort, Sore Pain Intervention(s): Monitored during session, Ice applied, Repositioned    Home Living Family/patient expects to be discharged to:: Private residence Living Arrangements: Spouse/significant other Available Help at Discharge: Family;Available 24 hours/day Type of Home: House Home Access: Stairs to enter Entrance Stairs-Rails: Right Entrance Stairs-Number of Steps: 5+3 front, 2+3 side Alternate Level Stairs-Number of Steps: flight Home Layout: Two level;Able to live on main level with bedroom/bathroom Home Equipment: None      Prior Function Prior Level of Function : Independent/Modified Independent;Driving  Mobility Comments: pain in knee prior to surgery, difficulty with steps but overall independent       Hand Dominance   Dominant Hand: Right    Extremity/Trunk  Assessment   Upper Extremity Assessment Upper Extremity Assessment: Defer to OT evaluation    Lower Extremity Assessment Lower Extremity Assessment: LLE deficits/detail LLE Deficits / Details: general weakness and pain with exercises/ROM    Cervical / Trunk Assessment Cervical / Trunk Assessment: Normal  Communication   Communication: No difficulties  Cognition Arousal/Alertness: Awake/alert Behavior During Therapy: WFL for tasks assessed/performed Overall Cognitive Status: Within Functional Limits for tasks assessed                                          General Comments      Exercises Total Joint Exercises Ankle Circles/Pumps: AROM, Both, 10 reps Quad Sets: AROM, Left, 10 reps Heel Slides: AROM, Left, 10 reps Hip ABduction/ADduction: AAROM, Left, 10 reps Straight Leg Raises: AAROM, Left, 10 reps Knee Flexion: AAROM, Left, 5 reps Goniometric ROM: 3-90   Assessment/Plan    PT Assessment Patient needs continued PT services  PT Problem List Decreased strength;Decreased mobility;Decreased range of motion;Decreased activity tolerance;Pain;Decreased skin integrity       PT Treatment Interventions DME instruction;Therapeutic exercise;Gait training;Balance training;Stair training;Neuromuscular re-education;Functional mobility training;Therapeutic activities;Patient/family education    PT Goals (Current goals can be found in the Care Plan section)  Acute Rehab PT Goals Patient Stated Goal: to return home today PT Goal Formulation: With patient Time For Goal Achievement: 06/25/22 Potential to Achieve Goals: Good    Frequency BID     Co-evaluation               AM-PAC PT "6 Clicks" Mobility  Outcome Measure Help needed turning from your back to your side while in a flat bed without using bedrails?: A Little Help needed moving from lying on your back to sitting on the side of a flat bed without using bedrails?: A Little Help needed moving to  and from a bed to a chair (including a wheelchair)?: A Little Help needed standing up from a chair using your arms (e.g., wheelchair or bedside chair)?: A Little Help needed to walk in hospital room?: A Little Help needed climbing 3-5 steps with a railing? : A Little 6 Click Score: 18    End of Session Equipment Utilized During Treatment: Gait belt Activity Tolerance: Patient tolerated treatment well Patient left: in chair;with call bell/phone within reach Nurse Communication: Mobility status PT Visit Diagnosis: Muscle weakness (generalized) (M62.81);Difficulty in walking, not elsewhere classified (R26.2);Pain Pain - Right/Left: Left Pain - part of body: Knee    Time: 0950-1006 PT Time Calculation (min) (ACUTE ONLY): 16 min   Charges:   PT Evaluation $PT Eval Moderate Complexity: 1 Mod PT Treatments $Therapeutic Exercise: 8-22 mins        Dejaun Vidrio, PT, GCS 06/18/22,10:50 AM

## 2022-06-18 NOTE — Progress Notes (Signed)
   Subjective: 1 Day Post-Op Procedure(s) (LRB): CONVERSION OF UNICOMPARTMENTAL ARTHROPLASTY TO LEFT TOTAL KNEE ARTHROPLASTY (Left) Patient reports pain as mild.   Patient is well, and has had no acute complaints or problems Denies any CP, SOB, ABD pain. We will continue therapy today.  Plan is to go Home after hospital stay.  Objective: Vital signs in last 24 hours: Temp:  [97.9 F (36.6 C)-98.8 F (37.1 C)] 98.1 F (36.7 C) (09/28 0500) Pulse Rate:  [76-102] 81 (09/28 0500) Resp:  [15-20] 18 (09/28 0500) BP: (108-142)/(67-85) 108/67 (09/28 0500) SpO2:  [94 %-98 %] 96 % (09/28 0500)  Intake/Output from previous day: 09/27 0701 - 09/28 0700 In: 2785.7 [I.V.:1985.7; IV Piggyback:800] Out: 3159 [Urine:1325; Drains:180; Blood:50] Intake/Output this shift: No intake/output data recorded.  No results for input(s): "HGB" in the last 72 hours. No results for input(s): "WBC", "RBC", "HCT", "PLT" in the last 72 hours. No results for input(s): "NA", "K", "CL", "CO2", "BUN", "CREATININE", "GLUCOSE", "CALCIUM" in the last 72 hours. No results for input(s): "LABPT", "INR" in the last 72 hours.  EXAM General - Patient is Alert, Appropriate, and Oriented Extremity - Neurovascular intact Sensation intact distally Intact pulses distally Dorsiflexion/Plantar flexion intact No cellulitis present Compartment soft Dressing - dressing C/D/I and no drainage, hemnovac removed Motor Function - intact, moving foot and toes well on exam.   Past Medical History:  Diagnosis Date   Acquired trigger finger 09/08/2019   Acute meniscal tear, medial 09/08/2019   Allergy    Arthritis    Cyst of tendon sheath 09/08/2019   GERD (gastroesophageal reflux disease)    HLD (hyperlipidemia)    PONV (postoperative nausea and vomiting)    Routine physical examination 12/17/2016    Assessment/Plan:   1 Day Post-Op Procedure(s) (LRB): CONVERSION OF UNICOMPARTMENTAL ARTHROPLASTY TO LEFT TOTAL KNEE  ARTHROPLASTY (Left) Principal Problem:   S/P revision of total knee  Estimated body mass index is 25.15 kg/m as calculated from the following:   Height as of 06/09/22: '5\' 3"'$  (1.6 m).   Weight as of 06/09/22: 64.4 kg. Advance diet Up with therapy Vital signs stable Pain well controlled CM to assist with discharge to home with HHPT today pending completion of PT goals  DVT Prophylaxis - Lovenox, TED hose, and SCDs Weight-Bearing as tolerated to left leg   T. Rachelle Hora, PA-C [facility] 06/18/2022, 7:06 AM

## 2022-06-18 NOTE — Discharge Summary (Signed)
Physician Discharge Summary  Patient ID: PAISLY FINGERHUT MRN: [id number] DOB/AGE: October 30, 1955 66 y.o.  Admit date: 06/17/2022 Discharge date: 06/18/2022  Admission Diagnoses:  S/P revision of total knee [Z96.659] Loose implants s/p left unicompartmental knee arthroplasty  Discharge Diagnoses: Patient Active Problem List   Diagnosis Date Noted   S/P revision of total knee 06/17/2022   Annual physical exam 04/22/2022   Neck stiffness 04/22/2022   Prediabetes 04/22/2022   Discoloration and thickening of nails both feet 04/22/2022   Encounter for subsequent annual wellness visit (AWV) in Medicare patient 05/22/2021   Chronic elbow pain, left 05/22/2021   Elevated serum glucose 05/22/2021   Encounter for vitamin deficiency screening 05/22/2021   Need for 23-polyvalent pneumococcal polysaccharide vaccine 05/22/2021   Need for diphtheria-tetanus-pertussis (Tdap) vaccine 05/22/2021   Continuous leakage of urine 05/22/2021   Seasonal allergies 12/17/2016   Osteoarthritis 12/17/2016   GERD (gastroesophageal reflux disease) 12/17/2016   History of total knee arthroplasty 07/20/2016    Past Medical History:  Diagnosis Date   Acquired trigger finger 09/08/2019   Acute meniscal tear, medial 09/08/2019   Allergy    Arthritis    Cyst of tendon sheath 09/08/2019   GERD (gastroesophageal reflux disease)    HLD (hyperlipidemia)    PONV (postoperative nausea and vomiting)    Routine physical examination 12/17/2016     Transfusion: None   Consultants (if any):   Discharged Condition: Improved  Hospital Course: ZADAYA CUADRA is an 66 y.o. female who was admitted 06/17/2022 with a diagnosis of loose implants s/p left unicompartmental knee arthroplasty and went to the operating room on 06/17/2022 and underwent the above named procedures.    Surgeries: Procedure(s): CONVERSION OF UNICOMPARTMENTAL ARTHROPLASTY TO LEFT TOTAL KNEE ARTHROPLASTY on 06/17/2022 Patient tolerated the surgery well.  Taken to PACU where she was stabilized and then transferred to the orthopedic floor.  Started on Lovenox 30 mg q 12 hrs. Foot pumps applied bilaterally at 80 mm. Heels elevated on bed with rolled towels. No evidence of DVT. Negative Homan. Physical therapy started on day #1 for gait training and transfer. OT started day #1 for ADL and assisted devices.  Patient's foley was d/c on day #1. Patient's IV and hemovac was d/c on day #1.  Was able to safely complete all physical therapy goals.  Patient was stable and ready for discharge to home with home health PT postop day 1  Implants: DePuy Attune size 4N posterior stabilized femoral component (cemented), size 3 rotating platform tibial component (cemented), 35 mm medialized dome patella (cemented), and a 12 mm stabilized rotating platform polyethylene insert.   She was given perioperative antibiotics:  Anti-infectives (From admission, onward)    Start     Dose/Rate Route Frequency Ordered Stop   06/17/22 2000  ceFAZolin (ANCEF) IVPB 2g/100 mL premix        2 g 200 mL/hr over 30 Minutes Intravenous Every 8 hours 06/17/22 1733 06/18/22 1443   06/17/22 1830  terbinafine (LAMISIL) tablet 250 mg        250 mg Oral Every evening 06/17/22 1733     06/17/22 0937  ceFAZolin (ANCEF) 2-4 GM/100ML-% IVPB       Note to Pharmacy: Trudie Reed S: cabinet override      06/17/22 0937 06/17/22 1216   06/17/22 0600  ceFAZolin (ANCEF) IVPB 2g/100 mL premix        2 g 200 mL/hr over 30 Minutes Intravenous On call to O.R. 06/16/22 2313 06/17/22 1211     .  She was given sequential compression devices, early ambulation, and Lovenox, teds for DVT prophylaxis.  She benefited maximally from the hospital stay and there were no complications.    Recent vital signs:  Vitals:   06/18/22 0500 06/18/22 0842  BP: 108/67 131/74  Pulse: 81 76  Resp: 18 17  Temp: 98.1 F (36.7 C) 98 F (36.7 C)  SpO2: 96% 97%    Recent laboratory studies:  Lab Results   Component Value Date   HGB 13.6 04/22/2022   HGB 13.1 05/22/2021   HGB 13.3 05/21/2020   Lab Results  Component Value Date   WBC 5.9 04/22/2022   PLT 249 04/22/2022   No results found for: "INR" Lab Results  Component Value Date   NA 141 04/22/2022   K 4.3 04/22/2022   CL 103 04/22/2022   CO2 23 04/22/2022   BUN 14 04/22/2022   CREATININE 0.69 04/22/2022   GLUCOSE 98 04/22/2022    Discharge Medications:   Allergies as of 06/18/2022       Reactions   Pollen Extract-tree Extract [pollen Extract]         Medication List     STOP taking these medications    meloxicam 7.5 MG tablet Commonly known as: MOBIC   naproxen sodium 220 MG tablet Commonly known as: ALEVE       TAKE these medications    acetaminophen 325 MG tablet Commonly known as: TYLENOL Take 1-2 tablets (325-650 mg total) by mouth every 6 (six) hours as needed for mild pain (pain score 1-3 or temp > 100.5).   celecoxib 200 MG capsule Commonly known as: CELEBREX Take 1 capsule (200 mg total) by mouth 2 (two) times daily for 14 days.   enoxaparin 40 MG/0.4ML injection Commonly known as: LOVENOX Inject 0.4 mLs (40 mg total) into the skin daily for 14 days.   omeprazole 20 MG capsule Commonly known as: PRILOSEC Take 20 mg by mouth daily as needed.   ondansetron 4 MG tablet Commonly known as: ZOFRAN Take 1 tablet (4 mg total) by mouth every 6 (six) hours as needed for nausea.   oxyCODONE 5 MG immediate release tablet Commonly known as: Oxy IR/ROXICODONE Take 1 tablet (5 mg total) by mouth every 4 (four) hours as needed for moderate pain (pain score 4-6).   rosuvastatin 10 MG tablet Commonly known as: CRESTOR TAKE 1 TABLET BY MOUTH EVERY DAY What changed: when to take this   senna-docusate 8.6-50 MG tablet Commonly known as: Senokot-S Take 1 tablet by mouth 2 (two) times daily.   terbinafine 250 MG tablet Commonly known as: LAMISIL Take 250 mg by mouth every evening.   traMADol 50  MG tablet Commonly known as: ULTRAM Take 1-2 tablets (50-100 mg total) by mouth every 4 (four) hours as needed for moderate pain.               Durable Medical Equipment  (From admission, onward)           Start     Ordered   06/18/22 0937  For home use only DME Bedside commode  Once       Question:  Patient needs a bedside commode to treat with the following condition  Answer:  Impaired mobility   06/18/22 0936   06/18/22 0937  For home use only DME Walker rolling  Once       Question Answer Comment  Walker: With [place]   Patient needs a walker to treat with  the following condition Impaired mobility      06/18/22 0936   06/17/22 1734  DME Walker rolling  Once       Question:  Patient needs a walker to treat with the following condition  Answer:  Total knee replacement status   06/17/22 1733   06/17/22 1734  DME Bedside commode  Once       Question:  Patient needs a bedside commode to treat with the following condition  Answer:  Total knee replacement status   06/17/22 1733            Diagnostic Studies: DG Knee Left Port  Result Date: 06/17/2022 CLINICAL DATA:  Postoperative arthroplasty. EXAM: PORTABLE LEFT KNEE - 1-2 VIEW COMPARISON:  None Available. FINDINGS: There is a left knee total arthroplasty in anatomic alignment. No fracture or hardware loosening. There is anterior soft tissue swelling, air and skin staples compatible with recent surgery. Surgical drain is seen in the superior joint space. IMPRESSION: Left knee total arthroplasty in anatomic alignment. Electronically Signed   By: Ronney Asters M.D.   On: 06/17/2022 16:43    Disposition: Plan for discharge home this afternoon pending progress with PT.     Follow-up Information     Watt Climes, PA Follow up on 07/01/2022.   Specialty: Physician Assistant Why: at 9:45am Contact information: [place] [address] [phone]         Dereck Leep, MD Follow up on  07/30/2022.   Specialty: Orthopedic Surgery Why: at 2:30pm Contact information: [place] [address] [phone]                Signed: Judson Roch PA-C 06/18/2022, 4:34 PM

## 2022-06-18 NOTE — Progress Notes (Signed)
Physical Therapy Treatment Patient Details Name: Ellen Montoya MRN: [id number] DOB: [birth year] Today's Date: 06/18/2022   History of Present Illness Ellen Montoya is a 66 y.o. with PMH of OA. Pt s/p conversion of a partial total knee to a total arthroplasty on 06/17/2022    PT Comments    Patient received in recliner, ready for PT. She reports mild tightness, mild pain only with flexion, exercises. She is mod I with sit to stand. Ambulated 175 feet with RW supervision and up/down 4 steps with min guard. Patient has met all acute PT goals at this time and is ready to go home. Will continue to work with her until she is discharged.      Recommendations for follow up therapy are one component of a multi-disciplinary discharge planning process, led by the attending physician.  Recommendations may be updated based on patient status, additional functional criteria and insurance authorization.  Follow Up Recommendations  Home health PT     Assistance Recommended at Discharge Intermittent Supervision/Assistance  Patient can return home with the following A little help with walking and/or transfers;A little help with bathing/dressing/bathroom;Assist for transportation;Assistance with cooking/housework;Help with stairs or ramp for entrance   Equipment Recommendations  Rolling walker (2 wheels)    Recommendations for Other Services       Precautions / Restrictions Precautions Precautions: Fall Restrictions Weight Bearing Restrictions: Yes LLE Weight Bearing: Weight bearing as tolerated     Mobility  Bed Mobility               General bed mobility comments: patient in recliner    Transfers Overall transfer level: Independent Equipment used: Rolling walker (2 wheels) Transfers: Sit to/from Stand Sit to Stand: Modified independent (Device/Increase time)                Ambulation/Gait Ambulation/Gait assistance: Supervision Gait Distance (Feet): 175 Feet Assistive  device: Rolling walker (2 wheels) Gait Pattern/deviations: Step-through pattern, Decreased step length - right, Decreased step length - left, Decreased stride length Gait velocity: decr     General Gait Details: generally safe, reciprocal pattern.   Stairs Stairs: Yes Stairs assistance: Min guard Stair Management: One rail Left, Step to pattern, Sideways Number of Stairs: 4 General stair comments: difficulty weight shifting, but safe, good awareness   Wheelchair Mobility    Modified Rankin (Stroke Patients Only)       Balance Overall balance assessment: Modified Independent Sitting-balance support: Feet supported Sitting balance-Leahy Scale: Normal     Standing balance support: Bilateral upper extremity supported, During functional activity, Reliant on assistive device for balance Standing balance-Leahy Scale: Good                              Cognition Arousal/Alertness: Awake/alert Behavior During Therapy: WFL for tasks assessed/performed Overall Cognitive Status: Within Functional Limits for tasks assessed                                          Exercises Total Joint Exercises Ankle Circles/Pumps: AROM, Both, 10 reps Quad Sets: AROM, Left, 10 reps Short Arc Quad: AAROM, Left, 10 reps Heel Slides: Left, 10 reps, AROM Hip ABduction/ADduction: AAROM, Left, 10 reps Straight Leg Raises: AAROM, Left, 10 reps Knee Flexion: AAROM, Left, 5 reps Goniometric ROM: 3-90    General Comments  Pertinent Vitals/Pain Pain Assessment Pain Assessment: Faces Faces Pain Scale: Hurts a little bit Pain Location: L knee with flexion/exercises Pain Descriptors / Indicators: Discomfort, Sore, Tightness Pain Intervention(s): Monitored during session    Home Living Family/patient expects to be discharged to:: Private residence Living Arrangements: Spouse/significant other Available Help at Discharge: Family;Available 24 hours/day Type of  Home: House Home Access: Stairs to enter Entrance Stairs-Rails: Right Entrance Stairs-Number of Steps: 5+3 front, 2+3 side Alternate Level Stairs-Number of Steps: flight Home Layout: Two level;Able to live on main level with bedroom/bathroom Home Equipment: None      Prior Function            PT Goals (current goals can now be found in the care plan section) Acute Rehab PT Goals Patient Stated Goal: to return home today PT Goal Formulation: With patient Time For Goal Achievement: 06/25/22 Potential to Achieve Goals: Good Progress towards PT goals: Progressing toward goals    Frequency    BID      PT Plan Current plan remains appropriate    Co-evaluation              AM-PAC PT "6 Clicks" Mobility   Outcome Measure  Help needed turning from your back to your side while in a flat bed without using bedrails?: A Little Help needed moving from lying on your back to sitting on the side of a flat bed without using bedrails?: A Little Help needed moving to and from a bed to a chair (including a wheelchair)?: A Little Help needed standing up from a chair using your arms (e.g., wheelchair or bedside chair)?: A Little Help needed to walk in hospital room?: A Little Help needed climbing 3-5 steps with a railing? : A Little 6 Click Score: 18    End of Session Equipment Utilized During Treatment: Gait belt Activity Tolerance: Patient tolerated treatment well Patient left: in chair;with call bell/phone within reach Nurse Communication: Mobility status PT Visit Diagnosis: Muscle weakness (generalized) (M62.81);Difficulty in walking, not elsewhere classified (R26.2);Pain Pain - Right/Left: Left Pain - part of body: Knee     Time: 4580-9983 PT Time Calculation (min) (ACUTE ONLY): 18 min  Charges:  $Therapeutic Exercise: 8-22 mins                     Ercel Pepitone, PT, GCS 06/18/22,1:58 PM

## 2022-06-18 NOTE — Anesthesia Postprocedure Evaluation (Signed)
Anesthesia Post Note  Patient: Ellen Montoya  Procedure(s) Performed: CONVERSION OF UNICOMPARTMENTAL ARTHROPLASTY TO LEFT TOTAL KNEE ARTHROPLASTY (Left: Knee)  Patient location during evaluation: Nursing Unit Anesthesia Type: Spinal Level of consciousness: oriented and awake and alert Pain management: pain level controlled Vital Signs Assessment: post-procedure vital signs reviewed and stable Respiratory status: spontaneous breathing and respiratory function stable Cardiovascular status: blood pressure returned to baseline and stable Postop Assessment: no headache, no backache, no apparent nausea or vomiting and patient able to bend at knees Anesthetic complications: no   No notable events documented.   Last Vitals:  Vitals:   06/17/22 2327 06/18/22 0500  BP: (!) 141/72 108/67  Pulse: 78 81  Resp: 20 18  Temp: 37.1 C 36.7 C  SpO2: 97% 96%    Last Pain:  Vitals:   06/18/22 0552  TempSrc:   PainSc: 4                  Caryl Asp

## 2022-06-19 ENCOUNTER — Telehealth: Payer: Self-pay | Admitting: *Deleted

## 2022-06-19 NOTE — Patient Outreach (Signed)
  Care Coordination [facility] Note Transition Care Management Follow-up Telephone Call Date of discharge and from where: [id number] [facility] How have you been since you were released from the hospital? Doing pretty good. Didn't sleep to well last night due to the being a little uncomfortable Any questions or concerns? No  Items Reviewed: Did the pt receive and understand the discharge instructions provided? Yes  Medications obtained and verified? Yes  RN went over how to give the Lovenox injection Other? No  Any new allergies since your discharge? No  Dietary orders reviewed? No Do you have support at home? Yes Daughter and husband  [place] and Equipment/Supplies: Were home health services ordered? yes If so, what is the name of the agency? Patient couldn't remember the name  Has the agency set up a time to come to the patient's home? yes Were any new equipment or medical supplies ordered?  No What is the name of the medical supply agency? N  Were you able to get the supplies/equipment? no Do you have any questions related to the use of the equipment or supplies? No  Functional Questionnaire: (I = Independent and D = Dependent) ADLs: D  Bathing/Dressing- D  Meal Prep- D  Eating- I  Maintaining continence- I  Transferring/Ambulation- D use a walker  Managing Meds- I  Follow up appointments reviewed:  PCP Hospital f/u appt confirmed? No  Scheduled to see N . [facility] Hospital f/u appt confirmed? Yes  Scheduled to see Encarnacion Chu, PA 07/01/2022; at 9:45am F/u Hooten, Laurice Record, MD (Orthopedic Surgery) on 07/30/2022; at 2:30pm Are transportation arrangements needed? No  If their condition worsens, is the pt aware to call PCP or go to the Emergency Dept.? Yes Was the patient provided with contact information for the PCP's office or ED? Yes Was to pt encouraged to call back with questions or concerns? Yes  SDOH assessments and interventions completed:   Yes  Care Coordination  Interventions Activated:  Yes   Care Coordination Interventions:  No Care Coordination interventions needed at this time.   Encounter Outcome:  Pt. Visit Completed    [place] [facility] [phone]

## 2022-06-22 ENCOUNTER — Other Ambulatory Visit: Payer: Self-pay | Admitting: Family Medicine

## 2022-06-22 DIAGNOSIS — E7800 Pure hypercholesterolemia, unspecified: Secondary | ICD-10-CM

## 2022-06-22 LAB — AEROBIC/ANAEROBIC CULTURE W GRAM STAIN (SURGICAL/DEEP WOUND)
Culture: NO GROWTH
Gram Stain: NONE SEEN

## 2023-01-03 ENCOUNTER — Other Ambulatory Visit: Payer: Self-pay | Admitting: Family Medicine

## 2023-01-03 DIAGNOSIS — E7800 Pure hypercholesterolemia, unspecified: Secondary | ICD-10-CM

## 2023-01-04 NOTE — Telephone Encounter (Signed)
Requested Prescriptions  Pending Prescriptions Disp Refills   rosuvastatin (CRESTOR) 10 MG tablet [Pharmacy Med Name: ROSUVASTATIN CALCIUM 10 MG TAB] 90 tablet 0    Sig: TAKE 1 TABLET BY MOUTH EVERY DAY IN THE EVENING     Cardiovascular:  Antilipid - Statins 2 Failed - 01/03/2023  9:00 AM      Failed - Lipid Panel in normal range within the last 12 months    Cholesterol, Total  Date Value Ref Range Status  04/22/2022 212 (H) 100 - 199 mg/dL Final   LDL Chol Calc (NIH)  Date Value Ref Range Status  04/22/2022 125 (H) 0 - 99 mg/dL Final   HDL  Date Value Ref Range Status  04/22/2022 52 >39 mg/dL Final   Triglycerides  Date Value Ref Range Status  04/22/2022 199 (H) 0 - 149 mg/dL Final         Passed - Cr in normal range and within 360 days    Creatinine, Ser  Date Value Ref Range Status  04/22/2022 0.69 0.57 - 1.00 mg/dL Final         Passed - Patient is not pregnant      Passed - Valid encounter within last 12 months    Recent Outpatient Visits           8 months ago Annual physical exam   [facility] Merita Norton T, FNP   1 year ago Encounter for subsequent annual wellness visit (AWV) in Medicare patient   [facility] Merita Norton T, FNP   2 years ago Fever, unspecified fever cause   [facility] Trey Sailors, [place]   2 years ago Annual physical exam   [facility] Osvaldo Angst M, [place]   3 years ago Encounter for screening mammogram for malignant neoplasm of breast   [facility] [place], Lavella Hammock, [place]       Future Appointments             In 3 months Suzie Portela, Daryl Eastern, FNP [facility]

## 2023-04-02 ENCOUNTER — Other Ambulatory Visit: Payer: Self-pay | Admitting: Family Medicine

## 2023-04-02 DIAGNOSIS — E7800 Pure hypercholesterolemia, unspecified: Secondary | ICD-10-CM

## 2023-04-06 ENCOUNTER — Ambulatory Visit (INDEPENDENT_AMBULATORY_CARE_PROVIDER_SITE_OTHER): Payer: PPO

## 2023-04-06 VITALS — Ht 63.0 in | Wt 142.0 lb

## 2023-04-06 DIAGNOSIS — Z0000 Encounter for general adult medical examination without abnormal findings: Secondary | ICD-10-CM

## 2023-04-06 NOTE — Patient Instructions (Signed)
Ellen Montoya , Thank you for taking time to come for your Medicare Wellness Visit. I appreciate your ongoing commitment to your health goals. Please review the following plan we discussed and let me know if I can assist you in the future.   These are the goals we discussed:  Goals      Manage Pain     Evidence-based guidance:  Develop a mutual, multimodal pain management plan with patient and caregiver; determine person's knowledge of the condition, any concerns or fears, personal preferences and ability to access services.  Assess pain level, treatment efficacy and patient response at regular intervals using a consistent pain scale; review the effectiveness and tolerability of all treatments.  Correlate pain with impact on daily activities, occupation, mood, sleep quality, comorbidities, other (nonarthritic) pain, fall risk and quality of life.  Encourage nonpharmacologic measures, such as applied heat or cold, exercise, physical or occupational therapy, orthoses, cognitive behavioral therapy, yoga, tai-chi, acupuncture, adequate sleep and weight loss.  Provide anticipatory guidance regarding benefits to multimodal treatment that improves quality of life.  Consider the use of transcutaneous TENS (electrical nerve stimulation), shock-absorbing footwear, orthoses, mineral baths, nutraceuticals or electromagnetic-field therapy.  Prepare patient for individualized pharmacologic pain management in a stepped approach based on presentation, risk and comorbidities; monitor and manage side effects and anticipate periodic adjustments.  Promote individualized plan to stay active when unable to participate in physical therapy, such as passive and active range of motion, yoga, walking, water aerobics or swimming.  Support and optimize psychosocial response to pain.   Notes: pt has right knee pain, she will manage with orthopedic MD.        This is a list of the screening recommended for you and due dates:   Health Maintenance  Topic Date Due   Colon Cancer Screening  Never done   Zoster (Shingles) Vaccine (1 of 2) Never done   COVID-19 Vaccine (1 - 2023-24 season) Never done   Flu Shot  04/22/2023   Medicare Annual Wellness Visit  04/05/2024   Mammogram  05/19/2024   DEXA scan (bone density measurement)  07/30/2025   DTaP/Tdap/Td vaccine (2 - Td or Tdap) 05/23/2031   Pneumonia Vaccine  Completed   Hepatitis C Screening  Completed   HPV Vaccine  Aged Out    Advanced directives: no  Conditions/risks identified: low falls risk  Next appointment: Follow up in one year for your annual wellness visit 04/10/2024 @ 8:45am telephone   Preventive Care 65 Years and Older, Female Preventive care refers to lifestyle choices and visits with your health care provider that can promote health and wellness. What does preventive care include? A yearly physical exam. This is also called an annual well check. Dental exams once or twice a year. Routine eye exams. Ask your health care provider how often you should have your eyes checked. Personal lifestyle choices, including: Daily care of your teeth and gums. Regular physical activity. Eating a healthy diet. Avoiding tobacco and drug use. Limiting alcohol use. Practicing safe sex. Taking low-dose aspirin every day. Taking vitamin and mineral supplements as recommended by your health care provider. What happens during an annual well check? The services and screenings done by your health care provider during your annual well check will depend on your age, overall health, lifestyle risk factors, and family history of disease. Counseling  Your health care provider may ask you questions about your: Alcohol use. Tobacco use. Drug use. Emotional well-being. Home and relationship well-being. Sexual activity. Eating  habits. History of falls. Memory and ability to understand (cognition). Work and work Astronomer. Reproductive health. Screening   You may have the following tests or measurements: Height, weight, and BMI. Blood pressure. Lipid and cholesterol levels. These may be checked every 5 years, or more frequently if you are over 71 years old. Skin check. Lung cancer screening. You may have this screening every year starting at age 82 if you have a 30-pack-year history of smoking and currently smoke or have quit within the past 15 years. Fecal occult blood test (FOBT) of the stool. You may have this test every year starting at age 24. Flexible sigmoidoscopy or colonoscopy. You may have a sigmoidoscopy every 5 years or a colonoscopy every 10 years starting at age 36. Hepatitis C blood test. Hepatitis B blood test. Sexually transmitted disease (STD) testing. Diabetes screening. This is done by checking your blood sugar (glucose) after you have not eaten for a while (fasting). You may have this done every 1-3 years. Bone density scan. This is done to screen for osteoporosis. You may have this done starting at age 44. Mammogram. This may be done every 1-2 years. Talk to your health care provider about how often you should have regular mammograms. Talk with your health care provider about your test results, treatment options, and if necessary, the need for more tests. Vaccines  Your health care provider may recommend certain vaccines, such as: Influenza vaccine. This is recommended every year. Tetanus, diphtheria, and acellular pertussis (Tdap, Td) vaccine. You may need a Td booster every 10 years. Zoster vaccine. You may need this after age 30. Pneumococcal 13-valent conjugate (PCV13) vaccine. One dose is recommended after age 62. Pneumococcal polysaccharide (PPSV23) vaccine. One dose is recommended after age 37. Talk to your health care provider about which screenings and vaccines you need and how often you need them. This information is not intended to replace advice given to you by your health care provider. Make sure you discuss  any questions you have with your health care provider. Document Released: 10/04/2015 Document Revised: 05/27/2016 Document Reviewed: 07/09/2015 Elsevier Interactive Patient Education  2017 [employer].  Fall Prevention in the Home Falls can cause injuries. They can happen to people of all ages. There are many things you can do to make your home safe and to help prevent falls. What can I do on the outside of my home? Regularly fix the edges of walkways and driveways and fix any cracks. Remove anything that might make you trip as you walk through a door, such as a raised step or threshold. Trim any bushes or trees on the path to your home. Use bright outdoor lighting. Clear any walking paths of anything that might make someone trip, such as rocks or tools. Regularly check to see if handrails are loose or broken. Make sure that both sides of any steps have handrails. Any raised decks and porches should have guardrails on the edges. Have any leaves, snow, or ice cleared regularly. Use sand or salt on walking paths during winter. Clean up any spills in your garage right away. This includes oil or grease spills. What can I do in the bathroom? Use night lights. Install grab bars by the toilet and in the tub and shower. Do not use towel bars as grab bars. Use non-skid mats or decals in the tub or shower. If you need to sit down in the shower, use a plastic, non-slip stool. Keep the floor dry. Clean up any water that  spills on the floor as soon as it happens. Remove soap buildup in the tub or shower regularly. Attach bath mats securely with double-sided non-slip rug tape. Do not have throw rugs and other things on the floor that can make you trip. What can I do in the bedroom? Use night lights. Make sure that you have a light by your bed that is easy to reach. Do not use any sheets or blankets that are too big for your bed. They should not hang down onto the floor. Have a firm chair that has  side arms. You can use this for support while you get dressed. Do not have throw rugs and other things on the floor that can make you trip. What can I do in the kitchen? Clean up any spills right away. Avoid walking on wet floors. Keep items that you use a lot in easy-to-reach places. If you need to reach something above you, use a strong step stool that has a grab bar. Keep electrical cords out of the way. Do not use floor polish or wax that makes floors slippery. If you must use wax, use non-skid floor wax. Do not have throw rugs and other things on the floor that can make you trip. What can I do with my stairs? Do not leave any items on the stairs. Make sure that there are handrails on both sides of the stairs and use them. Fix handrails that are broken or loose. Make sure that handrails are as long as the stairways. Check any carpeting to make sure that it is firmly attached to the stairs. Fix any carpet that is loose or worn. Avoid having throw rugs at the top or bottom of the stairs. If you do have throw rugs, attach them to the floor with carpet tape. Make sure that you have a light switch at the top of the stairs and the bottom of the stairs. If you do not have them, ask someone to add them for you. What else can I do to help prevent falls? Wear shoes that: Do not have high heels. Have rubber bottoms. Are comfortable and fit you well. Are closed at the toe. Do not wear sandals. If you use a stepladder: Make sure that it is fully opened. Do not climb a closed stepladder. Make sure that both sides of the stepladder are locked into place. Ask someone to hold it for you, if possible. Clearly mark and make sure that you can see: Any grab bars or handrails. First and last steps. Where the edge of each step is. Use tools that help you move around (mobility aids) if they are needed. These include: Canes. Walkers. Scooters. Crutches. Turn on the lights when you go into a dark area.  Replace any light bulbs as soon as they burn out. Set up your furniture so you have a clear path. Avoid moving your furniture around. If any of your floors are uneven, fix them. If there are any pets around you, be aware of where they are. Review your medicines with your doctor. Some medicines can make you feel dizzy. This can increase your chance of falling. Ask your doctor what other things that you can do to help prevent falls. This information is not intended to replace advice given to you by your health care provider. Make sure you discuss any questions you have with your health care provider. Document Released: 07/04/2009 Document Revised: 02/13/2016 Document Reviewed: 10/12/2014 Elsevier Interactive Patient Education  2017 [employer].

## 2023-04-06 NOTE — Progress Notes (Addendum)
Subjective:   Ellen Montoya is a 67 y.o. female who presents for an Initial Medicare Annual Wellness Visit.  Visit Complete: Virtual  I connected with  Ellen Montoya on 04/06/23 by a audio enabled telemedicine application and verified that I am speaking with the correct person using two identifiers.  Patient Location: Home  Provider Location: Office/Clinic  I discussed the limitations of evaluation and management by telemedicine. The patient expressed understanding and agreed to proceed.  Patient Medicare AWV questionnaire was completed by the patient on 04/05/23; I have confirmed that all information answered by patient is correct and no changes since this date.  Review of Systems     Cardiac Risk Factors include: advanced age (>36men, >65 women);dyslipidemia     Objective:    Today's Vitals   04/05/23 0944 04/06/23 0846  Weight:  142 lb (64.4 kg)  Height:  5\' 3"  (1.6 m)  PainSc: 1     Body mass index is 25.15 kg/m.     04/06/2023    8:53 AM 06/17/2022   10:04 AM 06/09/2022    1:39 PM  Advanced Directives  Does Patient Have a Medical Advance Directive? No No No  Would patient like information on creating a medical advance directive?  No - Patient declined No - Patient declined    Current Medications (verified) Outpatient Encounter Medications as of 04/06/2023  Medication Sig   omeprazole (PRILOSEC) 20 MG capsule Take 20 mg by mouth daily as needed.   rosuvastatin (CRESTOR) 10 MG tablet TAKE 1 TABLET BY MOUTH EVERY DAY IN THE EVENING   acetaminophen (TYLENOL) 325 MG tablet Take 1-2 tablets (325-650 mg total) by mouth every 6 (six) hours as needed for mild pain (pain score 1-3 or temp > 100.5).   enoxaparin (LOVENOX) 40 MG/0.4ML injection Inject 0.4 mLs (40 mg total) into the skin daily for 14 days.   ondansetron (ZOFRAN) 4 MG tablet Take 1 tablet (4 mg total) by mouth every 6 (six) hours as needed for nausea.   oxyCODONE (OXY IR/ROXICODONE) 5 MG immediate release  tablet Take 1 tablet (5 mg total) by mouth every 4 (four) hours as needed for moderate pain (pain score 4-6).   senna-docusate (SENOKOT-S) 8.6-50 MG tablet Take 1 tablet by mouth 2 (two) times daily.   terbinafine (LAMISIL) 250 MG tablet Take 250 mg by mouth every evening.   traMADol (ULTRAM) 50 MG tablet Take 1-2 tablets (50-100 mg total) by mouth every 4 (four) hours as needed for moderate pain.   No facility-administered encounter medications on file as of 04/06/2023.    Allergies (verified) Pollen extract-tree extract [pollen extract]   History: Past Medical History:  Diagnosis Date   Acquired trigger finger 09/08/2019   Acute meniscal tear, medial 09/08/2019   Allergy    Arthritis    Cyst of tendon sheath 09/08/2019   GERD (gastroesophageal reflux disease)    HLD (hyperlipidemia)    PONV (postoperative nausea and vomiting)    Routine physical examination 12/17/2016   Past Surgical History:  Procedure Laterality Date   COLONOSCOPY     JOINT REPLACEMENT Left    partial   laprascopy     endometriosis   MENISCUS REPAIR Left    TONSILLECTOMY     TOTAL KNEE REVISION Left 06/17/2022   Procedure: CONVERSION OF UNICOMPARTMENTAL ARTHROPLASTY TO LEFT TOTAL KNEE ARTHROPLASTY;  Surgeon: Donato Heinz, MD;  Location: [facility] ORS;  Service: Orthopedics;  Laterality: Left;   Family History  Problem Relation Age  of Onset   Heart disease Father        thinks a MI   Heart disease Maternal Grandmother    Stroke Maternal Grandmother    Leukemia Mother    Cancer Mother    ADD / ADHD Son    Ovarian cancer Neg Hx    Colon cancer Neg Hx    Breast cancer Neg Hx    Social History   Socioeconomic History   Marital status: Married    Spouse name: Not on file   Number of children: Not on file   Years of education: Not on file   Highest education level: Not on file  Occupational History   Not on file  Tobacco Use   Smoking status: Never   Smokeless tobacco: Never  Vaping Use    Vaping status: Never Used  Substance and Sexual Activity   Alcohol use: Never   Drug use: Never   Sexual activity: Yes    Birth control/protection: Post-menopausal  Other Topics Concern   Not on file  Social History Narrative   From [place]      Married      3 -children      Work at [employer]      Social Determinants of Health   Financial Resource Strain: Low Risk  (04/05/2023)   Overall Financial Resource Strain (CARDIA)    Difficulty of Paying Living Expenses: Not hard at all  Food Insecurity: No Food Insecurity (04/05/2023)   Hunger Vital Sign    Worried About Running Out of Food in the Last Year: Never true    Ran Out of Food in the Last Year: Never true  Transportation Needs: No Transportation Needs (04/05/2023)   PRAPARE - Administrator, Civil Service (Medical): No    Lack of Transportation (Non-Medical): No  Physical Activity: Sufficiently Active (04/05/2023)   Exercise Vital Sign    Days of Exercise per Week: 7 days    Minutes of Exercise per Session: 60 min  Stress: No Stress Concern Present (04/05/2023)   [employer] of Occupational Health - Occupational Stress Questionnaire    Feeling of Stress : Not at all  Social Connections: Unknown (04/05/2023)   Social Connection and Isolation Panel [NHANES]    Frequency of Communication with Friends and Family: More than three times a week    Frequency of Social Gatherings with Friends and Family: More than three times a week    Attends Religious Services: Not on Marketing executive or Organizations: Yes    Attends Engineer, structural: More than 4 times per year    Marital Status: Married    Tobacco Counseling Counseling given: Not Answered   Clinical Intake:  Pre-visit preparation completed: Yes  Pain : 0-10 Pain Score: 1  Pain Type: Chronic pain Pain Location: Knee (plus neck pain (1 on scale)) Pain Descriptors / Indicators: Aching     BMI -  recorded: 25.15 Nutritional Status: BMI 25 -29 Overweight Nutritional Risks: None Diabetes: No  How often do you need to have someone help you when you read instructions, pamphlets, or other written materials from your doctor or pharmacy?: 1 - Never  Interpreter Needed?: No  Comments: lives with husband Information entered by :: B.Raysa Bosak,LPN   Activities of Daily Living    04/05/2023    9:44 AM 06/17/2022    5:00 PM  In your present state of health, do you have any difficulty performing the  following activities:  Hearing? 0 0  Vision? 0 0  Difficulty concentrating or making decisions? 0 0  Walking or climbing stairs? 0 1  Dressing or bathing? 0 0  Doing errands, shopping? 0 0  Preparing Food and eating ? N   Using the Toilet? N   In the past six months, have you accidently leaked urine? Y   Do you have problems with loss of bowel control? N   Managing your Medications? N   Managing your Finances? N   Housekeeping or managing your Housekeeping? N     Patient Care Team: Jacky Kindle, FNP as PCP - General (Family Medicine)  Indicate any recent Medical Services you may have received from other than [facility] providers in the past year (date may be approximate).     Assessment:   This is a routine wellness examination for Maxwell.  Hearing/Vision screen Hearing Screening - Comments:: Adequate hearing Vision Screening - Comments:: Adequate vision with glasses for ready Dr Clydene Pugh  Dietary issues and exercise activities discussed:     Goals Addressed             This Visit's Progress    Manage Pain       Evidence-based guidance:  Develop a mutual, multimodal pain management plan with patient and caregiver; determine person's knowledge of the condition, any concerns or fears, personal preferences and ability to access services.  Assess pain level, treatment efficacy and patient response at regular intervals using a consistent pain scale; review the effectiveness and  tolerability of all treatments.  Correlate pain with impact on daily activities, occupation, mood, sleep quality, comorbidities, other (nonarthritic) pain, fall risk and quality of life.  Encourage nonpharmacologic measures, such as applied heat or cold, exercise, physical or occupational therapy, orthoses, cognitive behavioral therapy, yoga, tai-chi, acupuncture, adequate sleep and weight loss.  Provide anticipatory guidance regarding benefits to multimodal treatment that improves quality of life.  Consider the use of transcutaneous TENS (electrical nerve stimulation), shock-absorbing footwear, orthoses, mineral baths, nutraceuticals or electromagnetic-field therapy.  Prepare patient for individualized pharmacologic pain management in a stepped approach based on presentation, risk and comorbidities; monitor and manage side effects and anticipate periodic adjustments.  Promote individualized plan to stay active when unable to participate in physical therapy, such as passive and active range of motion, yoga, walking, water aerobics or swimming.  Support and optimize psychosocial response to pain.   Notes: pt has right knee pain, she will manage with orthopedic MD.       Depression Screen    04/06/2023    8:52 AM 04/22/2022   10:44 AM 05/22/2021    8:50 AM 09/08/2019    2:04 PM 01/21/2017    9:36 AM  PHQ 2/9 Scores  PHQ - 2 Score 0 0 0 0 0  PHQ- 9 Score  0 0 0     Fall Risk    04/05/2023    9:44 AM 04/22/2022   10:43 AM 05/22/2021    8:51 AM 09/08/2019    2:04 PM 01/21/2017    9:36 AM  Fall Risk   Falls in the past year? 1 0 0 1 No  Number falls in past yr: 0 0 0 1   Injury with Fall? 0 0 0 0   Risk for fall due to : No Fall Risks      Follow up Falls prevention discussed;Education provided Falls evaluation completed  Falls evaluation completed     MEDICARE RISK AT HOME:   TIMED  UP AND GO:  Was the test performed? No    Cognitive Function:        04/06/2023    8:59 AM  6CIT  Screen  What Year? 0 points  What month? 0 points  What time? 0 points  Count back from 20 0 points  Months in reverse 0 points  Repeat phrase 0 points  Total Score 0 points    Immunizations Immunization History  Administered Date(s) Administered   Influenza,inj,Quad PF,6+ Mos 12/17/2016, 10/16/2018   Influenza-Unspecified 06/19/2017   Pneumococcal Conjugate-13 05/21/2020   Pneumococcal Polysaccharide-23 05/22/2021   Tdap 05/22/2021    TDAP status: Up to date  Flu Vaccine status: Declined, Education has been provided regarding the importance of this vaccine but patient still declined. Advised may receive this vaccine at local pharmacy or Health Dept. Aware to provide a copy of the vaccination record if obtained from local pharmacy or Health Dept. Verbalized acceptance and understanding.  Pneumococcal vaccine status: Up to date  Covid-19 vaccine status: Completed vaccines  Qualifies for Shingles Vaccine? Yes   Zostavax completed No   Shingrix Completed?: No.    Education has been provided regarding the importance of this vaccine. Patient has been advised to call insurance company to determine out of pocket expense if they have not yet received this vaccine. Advised may also receive vaccine at local pharmacy or Health Dept. Verbalized acceptance and understanding.  Screening Tests Health Maintenance  Topic Date Due   Colonoscopy  Never done   Zoster Vaccines- Shingrix (1 of 2) Never done   COVID-19 Vaccine (1 - 2023-24 season) Never done   INFLUENZA VACCINE  04/22/2023   Medicare Annual Wellness (AWV)  04/05/2024   MAMMOGRAM  05/19/2024   DEXA SCAN  07/30/2025   DTaP/Tdap/Td (2 - Td or Tdap) 05/23/2031   Pneumonia Vaccine 84+ Years old  Completed   Hepatitis C Screening  Completed   HPV VACCINES  Aged Out    Health Maintenance  Health Maintenance Due  Topic Date Due   Colonoscopy  Never done   Zoster Vaccines- Shingrix (1 of 2) Never done   COVID-19 Vaccine (1 -  2023-24 season) Never done    Colorectal cancer screening: Type of screening: Cologuard. Completed yes. Repeat every 3 years  Mammogram status: Completed yes. Repeat every year  Bone Density status: Completed yes. Results reflect: Bone density results: NORMAL. Repeat every 5 years.  Lung Cancer Screening: (Low Dose CT Chest recommended if Age 62-80 years, 20 pack-year currently smoking OR have quit w/in 15years.) does not qualify.   Lung Cancer Screening Referral: no  Additional Screening:  Hepatitis C Screening: does not qualify; Completed yes  Vision Screening: Recommended annual ophthalmology exams for early detection of glaucoma and other disorders of the eye. Is the patient up to date with their annual eye exam?  Yes  Who is the provider or what is the name of the office in which the patient attends annual eye exams? Dr Clydene Pugh If pt is not established with a provider, would they like to be referred to a provider to establish care? No .   Dental Screening: Recommended annual dental exams for proper oral hygiene  Diabetic Foot Exam: n/a  Community Resource Referral / Chronic Care Management: CRR required this visit?  No   CCM required this visit?  No     Plan:     I have personally reviewed and noted the following in the patient's chart:   Medical and social history  Use of alcohol, tobacco or illicit drugs  Current medications and supplements including opioid prescriptions. Patient is not currently taking opioid prescriptions. Functional ability and status Nutritional status Physical activity Advanced directives List of other physicians Hospitalizations, surgeries, and ER visits in previous 12 months Vitals Screenings to include cognitive, depression, and falls Referrals and appointments  In addition, I have reviewed and discussed with patient certain preventive protocols, quality metrics, and best practice recommendations. A written personalized care plan for  preventive services as well as general preventive health recommendations were provided to patient.     Sue Lush, LPN   0/27/2536   After Visit Summary: (MyChart) Due to this being a telephonic visit, the after visit summary with patients personalized plan was offered to patient via MyChart   Nurse Notes: The patient states she is doing well and has no concerns or questions at this time.

## 2023-04-26 ENCOUNTER — Encounter: Payer: Self-pay | Admitting: Family Medicine

## 2023-04-26 ENCOUNTER — Ambulatory Visit (INDEPENDENT_AMBULATORY_CARE_PROVIDER_SITE_OTHER): Payer: PPO | Admitting: Family Medicine

## 2023-04-26 VITALS — BP 103/71 | HR 85 | Temp 97.9°F | Ht 63.0 in | Wt 140.2 lb

## 2023-04-26 DIAGNOSIS — D5911 Warm autoimmune hemolytic anemia: Secondary | ICD-10-CM | POA: Insufficient documentation

## 2023-04-26 DIAGNOSIS — K219 Gastro-esophageal reflux disease without esophagitis: Secondary | ICD-10-CM

## 2023-04-26 DIAGNOSIS — R7303 Prediabetes: Secondary | ICD-10-CM

## 2023-04-26 DIAGNOSIS — Z1211 Encounter for screening for malignant neoplasm of colon: Secondary | ICD-10-CM | POA: Insufficient documentation

## 2023-04-26 DIAGNOSIS — Z0000 Encounter for general adult medical examination without abnormal findings: Secondary | ICD-10-CM | POA: Diagnosis not present

## 2023-04-26 NOTE — Progress Notes (Signed)
Complete physical exam   Patient: Ellen Montoya   DOB: Oct 11, 1955   67 y.o. Female  MRN: [id number] Visit Date: 04/26/2023  Today's healthcare provider: Jacky Kindle, FNP  Introduced to nurse practitioner role and practice setting.  All questions answered.  Discussed provider/patient relationship and expectations.  Chief Complaint  Patient presents with   Annual Exam    Patient went off of rouvastatin due to muscle aches, would like to see if its the root cause    Subjective    Ellen Montoya is a 67 y.o. female who presents today for a complete physical exam.  She reports consuming a general diet. The patient does not participate in regular exercise at present. She generally feels fairly well. She reports sleeping fairly well. She does have additional problems to discuss today.  HPI HPI     Annual Exam    Additional comments: Patient went off of rouvastatin due to muscle aches, would like to see if its the root cause       Last edited by Ellen Montoya, CMA on 04/26/2023  9:25 AM.      Past Medical History:  Diagnosis Date   Acquired trigger finger 09/08/2019   Acute meniscal tear, medial 09/08/2019   Allergy    Arthritis    Cyst of tendon sheath 09/08/2019   GERD (gastroesophageal reflux disease)    HLD (hyperlipidemia)    PONV (postoperative nausea and vomiting)    Routine physical examination 12/17/2016   Past Surgical History:  Procedure Laterality Date   COLONOSCOPY     JOINT REPLACEMENT Left    partial   laprascopy     endometriosis   MENISCUS REPAIR Left    TONSILLECTOMY     TOTAL KNEE REVISION Left 06/17/2022   Procedure: CONVERSION OF UNICOMPARTMENTAL ARTHROPLASTY TO LEFT TOTAL KNEE ARTHROPLASTY;  Surgeon: Ellen Heinz, MD;  Location: [facility]S;  Service: Orthopedics;  Laterality: Left;   Social History   Socioeconomic History   Marital status: Married    Spouse name: Not on file   Number of children: Not on file   Years of education: Not  on file   Highest education level: Not on file  Occupational History   Not on file  Tobacco Use   Smoking status: Never   Smokeless tobacco: Never  Vaping Use   Vaping status: Never Used  Substance and Sexual Activity   Alcohol use: Never   Drug use: Never   Sexual activity: Yes    Birth control/protection: Post-menopausal  Other Topics Concern   Not on file  Social History Narrative   From [place]      Married      3 -children      Work at [employer]      Social Determinants of Health   Financial Resource Strain: Low Risk  (04/05/2023)   Overall Financial Resource Strain (CARDIA)    Difficulty of Paying Living Expenses: Not hard at all  Food Insecurity: No Food Insecurity (04/05/2023)   Hunger Vital Sign    Worried About Running Out of Food in the Last Year: Never true    Ran Out of Food in the Last Year: Never true  Transportation Needs: No Transportation Needs (04/05/2023)   PRAPARE - Administrator, Civil Service (Medical): No    Lack of Transportation (Non-Medical): No  Physical Activity: Sufficiently Active (04/05/2023)   Exercise Vital Sign    Days of Exercise  per Week: 7 days    Minutes of Exercise per Session: 60 min  Stress: No Stress Concern Present (04/05/2023)   [employer] of Occupational Health - Occupational Stress Questionnaire    Feeling of Stress : Not at all  Social Connections: Unknown (04/05/2023)   Social Connection and Isolation Panel [NHANES]    Frequency of Communication with Friends and Family: More than three times a week    Frequency of Social Gatherings with Friends and Family: More than three times a week    Attends Religious Services: Not on file    Active Member of Clubs or Organizations: Yes    Attends Banker Meetings: More than 4 times per year    Marital Status: Married  Catering manager Violence: Not At Risk (06/17/2022)   Humiliation, Afraid, Rape, and Kick questionnaire    Fear  of Current or Ex-Partner: No    Emotionally Abused: No    Physically Abused: No    Sexually Abused: No   Family Status  Relation Name Status   Father Ellen Montoya Alive   MGM Ellen Montoya (Not Specified)   Mother Ellen Montoya Deceased   Son Ellen Montoya (Not Specified)   Neg Hx  (Not Specified)  No partnership data on file   Family History  Problem Relation Age of Onset   Heart disease Father        thinks a MI   Heart disease Maternal Grandmother    Stroke Maternal Grandmother    Leukemia Mother    Cancer Mother    ADD / ADHD Son    Ovarian cancer Neg Hx    Colon cancer Neg Hx    Breast cancer Neg Hx    Allergies  Allergen Reactions   Pollen Extract-Tree Extract [Pollen Extract]     Patient Care Team: Jacky Kindle, FNP as PCP - General (Family Medicine)   Medications: Outpatient Medications Prior to Visit  Medication Sig   omeprazole (PRILOSEC) 20 MG capsule Take 20 mg by mouth daily as needed.   [DISCONTINUED] acetaminophen (TYLENOL) 325 MG tablet Take 1-2 tablets (325-650 mg total) by mouth every 6 (six) hours as needed for mild pain (pain score 1-3 or temp > 100.5).   [DISCONTINUED] enoxaparin (LOVENOX) 40 MG/0.4ML injection Inject 0.4 mLs (40 mg total) into the skin daily for 14 days.   [DISCONTINUED] ondansetron (ZOFRAN) 4 MG tablet Take 1 tablet (4 mg total) by mouth every 6 (six) hours as needed for nausea.   [DISCONTINUED] oxyCODONE (OXY IR/ROXICODONE) 5 MG immediate release tablet Take 1 tablet (5 mg total) by mouth every 4 (four) hours as needed for moderate pain (pain score 4-6).   [DISCONTINUED] rosuvastatin (CRESTOR) 10 MG tablet TAKE 1 TABLET BY MOUTH EVERY DAY IN THE EVENING (Patient not taking: Reported on 04/26/2023)   [DISCONTINUED] senna-docusate (SENOKOT-S) 8.6-50 MG tablet Take 1 tablet by mouth 2 (two) times daily.   [DISCONTINUED] terbinafine (LAMISIL) 250 MG tablet Take 250 mg by mouth every evening.   [DISCONTINUED] traMADol (ULTRAM) 50 MG tablet Take  1-2 tablets (50-100 mg total) by mouth every 4 (four) hours as needed for moderate pain.   No facility-administered medications prior to visit.    Review of Systems  Last CBC Lab Results  Component Value Date   WBC 5.9 04/22/2022   HGB 13.6 04/22/2022   HCT 39.3 04/22/2022   MCV 88 04/22/2022   MCH 30.4 04/22/2022   RDW 12.6 04/22/2022   PLT 249 04/22/2022   Last  metabolic panel Lab Results  Component Value Date   GLUCOSE 98 04/22/2022   NA 141 04/22/2022   K 4.3 04/22/2022   CL 103 04/22/2022   CO2 23 04/22/2022   BUN 14 04/22/2022   CREATININE 0.69 04/22/2022   EGFR 95 04/22/2022   CALCIUM 9.9 04/22/2022   PROT 6.8 04/22/2022   ALBUMIN 4.5 04/22/2022   LABGLOB 2.3 04/22/2022   AGRATIO 2.0 04/22/2022   BILITOT 0.9 04/22/2022   ALKPHOS 78 04/22/2022   AST 15 04/22/2022   ALT 12 04/22/2022   Last lipids Lab Results  Component Value Date   CHOL 212 (H) 04/22/2022   HDL 52 04/22/2022   LDLCALC 125 (H) 04/22/2022   TRIG 199 (H) 04/22/2022   CHOLHDL 4.1 04/22/2022   Last hemoglobin A1c Lab Results  Component Value Date   HGBA1C 5.8 (H) 04/22/2022    Objective    BP 103/71 (BP Location: Left Arm, Patient Position: Sitting, Cuff Size: Large)   Pulse 85   Temp 97.9 F (36.6 C) (Oral)   Ht 5\' 3"  (1.6 m)   Wt 140 lb 3.2 oz (63.6 kg)   SpO2 99%   BMI 24.84 kg/m   BP Readings from Last 3 Encounters:  04/26/23 103/71  06/18/22 (!) 150/78  06/09/22 107/75   Wt Readings from Last 3 Encounters:  04/26/23 140 lb 3.2 oz (63.6 kg)  04/06/23 142 lb (64.4 kg)  06/09/22 142 lb (64.4 kg)   Physical Exam Vitals and nursing note reviewed.  Constitutional:      General: She is awake. She is not in acute distress.    Appearance: Normal appearance. She is well-developed, well-groomed and normal weight. She is not ill-appearing, toxic-appearing or diaphoretic.  HENT:     Head: Normocephalic and atraumatic.     Jaw: There is normal jaw occlusion. No trismus,  tenderness, swelling or pain on movement.     Right Ear: Hearing, tympanic membrane, ear canal and external ear normal. There is no impacted cerumen.     Left Ear: Hearing, tympanic membrane, ear canal and external ear normal. There is no impacted cerumen.     Nose: Nose normal. No congestion or rhinorrhea.     Right Turbinates: Not enlarged, swollen or pale.     Left Turbinates: Not enlarged, swollen or pale.     Right Sinus: No maxillary sinus tenderness or frontal sinus tenderness.     Left Sinus: No maxillary sinus tenderness or frontal sinus tenderness.     Mouth/Throat:     Lips: Pink.     Mouth: Mucous membranes are moist. No injury.     Tongue: No lesions.     Pharynx: Oropharynx is clear. Uvula midline. No pharyngeal swelling, oropharyngeal exudate, posterior oropharyngeal erythema or uvula swelling.     Tonsils: No tonsillar exudate or tonsillar abscesses.  Eyes:     General: Lids are normal. Lids are everted, no foreign bodies appreciated. Vision grossly intact. Gaze aligned appropriately. No allergic shiner or visual field deficit.       Right eye: No discharge.        Left eye: No discharge.     Extraocular Movements: Extraocular movements intact.     Conjunctiva/sclera: Conjunctivae normal.     Right eye: Right conjunctiva is not injected. No exudate.    Left eye: Left conjunctiva is not injected. No exudate.    Pupils: Pupils are equal, round, and reactive to light.  Neck:     Thyroid: No thyroid  mass, thyromegaly or thyroid tenderness.     Vascular: No carotid bruit.     Trachea: Trachea normal.  Cardiovascular:     Rate and Rhythm: Normal rate and regular rhythm.     Pulses: Normal pulses.          Carotid pulses are 2+ on the right side and 2+ on the left side.      Radial pulses are 2+ on the right side and 2+ on the left side.       Dorsalis pedis pulses are 2+ on the right side and 2+ on the left side.       Posterior tibial pulses are 2+ on the right side and 2+  on the left side.     Heart sounds: Normal heart sounds, S1 normal and S2 normal. No murmur heard.    No friction rub. No gallop.  Pulmonary:     Effort: Pulmonary effort is normal. No respiratory distress.     Breath sounds: Normal breath sounds and air entry. No stridor. No wheezing, rhonchi or rales.  Chest:     Chest wall: No tenderness.  Abdominal:     General: Abdomen is flat. Bowel sounds are normal. There is no distension.     Palpations: Abdomen is soft. There is no mass.     Tenderness: There is no abdominal tenderness. There is no right CVA tenderness, left CVA tenderness, guarding or rebound.     Hernia: No hernia is present.  Genitourinary:    Comments: Exam deferred; denies complaints Musculoskeletal:        General: No swelling, tenderness, deformity or signs of injury. Normal range of motion.     Cervical back: Full passive range of motion without pain, normal range of motion and neck supple. No edema, rigidity or tenderness. No muscular tenderness.     Right lower leg: No edema.     Left lower leg: No edema.  Lymphadenopathy:     Cervical: No cervical adenopathy.     Right cervical: No superficial, deep or posterior cervical adenopathy.    Left cervical: No superficial, deep or posterior cervical adenopathy.  Skin:    General: Skin is warm and dry.     Capillary Refill: Capillary refill takes less than 2 seconds.     Coloration: Skin is not jaundiced or pale.     Findings: No bruising, erythema, lesion or rash.  Neurological:     General: No focal deficit present.     Mental Status: She is alert and oriented to person, place, and time. Mental status is at baseline.     GCS: GCS eye subscore is 4. GCS verbal subscore is 5. GCS motor subscore is 6.     Sensory: Sensation is intact. No sensory deficit.     Motor: Motor function is intact. No weakness.     Coordination: Coordination is intact. Coordination normal.     Gait: Gait is intact. Gait normal.  Psychiatric:         Attention and Perception: Attention and perception normal.        Mood and Affect: Mood and affect normal.        Speech: Speech normal.        Behavior: Behavior normal. Behavior is cooperative.        Thought Content: Thought content normal.        Cognition and Memory: Cognition and memory normal.        Judgment: Judgment normal.  Last depression screening scores    04/06/2023    8:52 AM 04/22/2022   10:44 AM 05/22/2021    8:50 AM  PHQ 2/9 Scores  PHQ - 2 Score 0 0 0  PHQ- 9 Score  0 0   Last fall risk screening    04/05/2023    9:44 AM  Fall Risk   Falls in the past year? 1  Number falls in past yr: 0  Injury with Fall? 0  Risk for fall due to : No Fall Risks  Follow up Falls prevention discussed;Education provided   Last Audit-C alcohol use screening    04/05/2023    9:44 AM  Alcohol Use Disorder Test (AUDIT)  1. How often do you have a drink containing alcohol? 0  3. How often do you have six or more drinks on one occasion? 0   A score of 3 or more in women, and 4 or more in men indicates increased risk for alcohol abuse, EXCEPT if all of the points are from question 1   No results found for any visits on 04/26/23.  Assessment & Plan    Routine Health Maintenance and Physical Exam  Exercise Activities and Dietary recommendations  Goals      Manage Pain     Evidence-based guidance:  Develop a mutual, multimodal pain management plan with patient and caregiver; determine person's knowledge of the condition, any concerns or fears, personal preferences and ability to access services.  Assess pain level, treatment efficacy and patient response at regular intervals using a consistent pain scale; review the effectiveness and tolerability of all treatments.  Correlate pain with impact on daily activities, occupation, mood, sleep quality, comorbidities, other (nonarthritic) pain, fall risk and quality of life.  Encourage nonpharmacologic measures, such as applied  heat or cold, exercise, physical or occupational therapy, orthoses, cognitive behavioral therapy, yoga, tai-chi, acupuncture, adequate sleep and weight loss.  Provide anticipatory guidance regarding benefits to multimodal treatment that improves quality of life.  Consider the use of transcutaneous TENS (electrical nerve stimulation), shock-absorbing footwear, orthoses, mineral baths, nutraceuticals or electromagnetic-field therapy.  Prepare patient for individualized pharmacologic pain management in a stepped approach based on presentation, risk and comorbidities; monitor and manage side effects and anticipate periodic adjustments.  Promote individualized plan to stay active when unable to participate in physical therapy, such as passive and active range of motion, yoga, walking, water aerobics or swimming.  Support and optimize psychosocial response to pain.   Notes: pt has right knee pain, she will manage with orthopedic MD.        Immunization History  Administered Date(s) Administered   Influenza,inj,Quad PF,6+ Mos 12/17/2016, 10/16/2018   Influenza-Unspecified 06/19/2017   Pneumococcal Conjugate-13 05/21/2020   Pneumococcal Polysaccharide-23 05/22/2021   Tdap 05/22/2021    Health Maintenance  Topic Date Due   Colonoscopy  Never done   Zoster Vaccines- Shingrix (1 of 2) Never done   COVID-19 Vaccine (1 - 2023-24 season) Never done   INFLUENZA VACCINE  12/20/2023 (Originally 04/22/2023)   Medicare Annual Wellness (AWV)  04/25/2024   MAMMOGRAM  05/19/2024   DEXA SCAN  07/30/2025   DTaP/Tdap/Td (2 - Td or Tdap) 05/23/2031   Pneumonia Vaccine 74+ Years old  Completed   Hepatitis C Screening  Completed   HPV VACCINES  Aged Out    Discussed health benefits of physical activity, and encouraged her to engage in regular exercise appropriate for her age and condition.  Problem List Items Addressed This Visit  Digestive   GERD (gastroesophageal reflux disease)    Chronic,  stable Plans to continue PPI as needed        Other   Annual physical exam - Primary    Plans to see ortho regarding L neck pain as well as knee pain UTD on dental and vision Denies hearing concerns Things to do to keep yourself healthy  - Exercise at least 30-45 minutes a day, 3-4 days a week.  - Eat a low-fat diet with lots of fruits and vegetables, up to 7-9 servings per day.  - Seatbelts can save your life. Wear them always.  - Smoke detectors on every level of your home, check batteries every year.  - Eye Doctor - have an eye exam every 1-2 years  - Safe sex - if you may be exposed to STDs, use a condom.  - Alcohol -  If you drink, do it moderately, less than 2 drinks per day.  - Health Care Power of Attorney. Choose someone to speak for you if you are not able.  - Depression is common in our stressful world.If you're feeling down or losing interest in things you normally enjoy, please come in for a visit.  - Violence - If anyone is threatening or hurting you, please call immediately.       Relevant Orders   CBC with Differential/Platelet   Comprehensive Metabolic Panel (CMET)   TSH   Lipid panel   Prediabetes    Chronic, stable Controlled by diet/exercise Repeat A1c Continue to recommend balanced, lower carb meals. Smaller meal size, adding snacks. Choosing water as drink of choice and increasing purposeful exercise.       Relevant Orders   Hemoglobin A1c   Screen for colon cancer    >10 years since previous screening; referral placed      Relevant Orders   Ambulatory referral to Gastroenterology   Warm reactive antibody (HCC)    Chronic, unknown Reports recent blood donation came back positive for AB; referral placed to hematology for additional assistance Patient is disappointed that she can no longer donate blood No complaints       Relevant Orders   Ambulatory referral to Hematology / Oncology   Return in about 1 year (around 04/25/2024) for annual  examination.    Ellen Merl, FNP, have reviewed all documentation for this visit. The documentation on 04/26/23 for the exam, diagnosis, procedures, and orders are all accurate and complete.  Jacky Kindle, FNP  [facility] [phone] (phone) [phone] (fax)  [facility]

## 2023-04-26 NOTE — Assessment & Plan Note (Signed)
Chronic, stable Plans to continue PPI as needed

## 2023-04-26 NOTE — Patient Instructions (Signed)
The CDC recommends two doses of Shingrix (the new shingles vaccine) separated by 2 to 6 months for adults age 69 years and older. I recommend checking with your insurance plan regarding coverage for this vaccine.    

## 2023-04-26 NOTE — Assessment & Plan Note (Signed)
Chronic, unknown Reports recent blood donation came back positive for AB; referral placed to hematology for additional assistance Patient is disappointed that she can no longer donate blood No complaints

## 2023-04-26 NOTE — Assessment & Plan Note (Signed)
Plans to see ortho regarding L neck pain as well as knee pain UTD on dental and vision Denies hearing concerns Things to do to keep yourself healthy  - Exercise at least 30-45 minutes a day, 3-4 days a week.  - Eat a low-fat diet with lots of fruits and vegetables, up to 7-9 servings per day.  - Seatbelts can save your life. Wear them always.  - Smoke detectors on every level of your home, check batteries every year.  - Eye Doctor - have an eye exam every 1-2 years  - Safe sex - if you may be exposed to STDs, use a condom.  - Alcohol -  If you drink, do it moderately, less than 2 drinks per day.  - Health Care Power of Attorney. Choose someone to speak for you if you are not able.  - Depression is common in our stressful world.If you're feeling down or losing interest in things you normally enjoy, please come in for a visit.  - Violence - If anyone is threatening or hurting you, please call immediately.

## 2023-04-26 NOTE — Assessment & Plan Note (Signed)
Chronic, stable Controlled by diet/exercise Repeat A1c Continue to recommend balanced, lower carb meals. Smaller meal size, adding snacks. Choosing water as drink of choice and increasing purposeful exercise.

## 2023-04-26 NOTE — Assessment & Plan Note (Signed)
>  10 years since previous screening; referral placed

## 2023-04-27 ENCOUNTER — Telehealth: Payer: Self-pay

## 2023-04-27 ENCOUNTER — Other Ambulatory Visit: Payer: Self-pay | Admitting: Family Medicine

## 2023-04-27 ENCOUNTER — Other Ambulatory Visit: Payer: Self-pay

## 2023-04-27 DIAGNOSIS — Z1211 Encounter for screening for malignant neoplasm of colon: Secondary | ICD-10-CM

## 2023-04-27 MED ORDER — NA SULFATE-K SULFATE-MG SULF 17.5-3.13-1.6 GM/177ML PO SOLN: 1.0000 | Freq: Once | ORAL | 0 refills | Status: AC

## 2023-04-27 NOTE — Telephone Encounter (Signed)
Patient return the call back to schedule her colonoscopy.

## 2023-04-27 NOTE — Telephone Encounter (Signed)
Gastroenterology Pre-Procedure Review  Request Date: 06/18/23 Requesting Physician: Dr. Allegra Lai  PATIENT REVIEW QUESTIONS: The patient responded to the following health history questions as indicated:    1. Are you having any GI issues? no 2. Do you have a personal history of Polyps? no 3. Do you have a family history of Colon Cancer or Polyps? no 4. Diabetes Mellitus? no 5. Joint replacements in the past 12 months?no 6. Major health problems in the past 3 months?no 7. Any artificial heart valves, MVP, or defibrillator?no    MEDICATIONS & ALLERGIES:    Patient reports the following regarding taking any anticoagulation/antiplatelet therapy:   Plavix, Coumadin, Eliquis, Xarelto, Lovenox, Pradaxa, Brilinta, or Effient? no Aspirin? no  Patient confirms/reports the following medications:  Current Outpatient Medications  Medication Sig Dispense Refill   omeprazole (PRILOSEC) 20 MG capsule Take 20 mg by mouth daily as needed.     No current facility-administered medications for this visit.    Patient confirms/reports the following allergies:  Allergies  Allergen Reactions   Pollen Extract-Tree Extract [Pollen Extract]     No orders of the defined types were placed in this encounter.   AUTHORIZATION INFORMATION Primary Insurance: 1D#: Group #:  Secondary Insurance: 1D#: Group #:  SCHEDULE INFORMATION: Date: 06/18/23 Time: Location: [facility]

## 2023-06-18 ENCOUNTER — Encounter: Payer: Self-pay | Admitting: Gastroenterology

## 2023-06-18 ENCOUNTER — Ambulatory Visit
Admission: RE | Admit: 2023-06-18 | Discharge: 2023-06-18 | Disposition: A | Discharge status: Home / Self Care | Payer: PPO | Source: Ambulatory Visit | Attending: Gastroenterology | Admitting: Gastroenterology

## 2023-06-18 ENCOUNTER — Ambulatory Visit: Payer: PPO | Admitting: Certified Registered"

## 2023-06-18 ENCOUNTER — Encounter
Admission: RE | Disposition: A | Discharge status: Home / Self Care | Payer: Self-pay | Source: Ambulatory Visit | Attending: Gastroenterology

## 2023-06-18 DIAGNOSIS — D122 Benign neoplasm of ascending colon: Secondary | ICD-10-CM | POA: Insufficient documentation

## 2023-06-18 DIAGNOSIS — D124 Benign neoplasm of descending colon: Secondary | ICD-10-CM | POA: Diagnosis not present

## 2023-06-18 DIAGNOSIS — Z1211 Encounter for screening for malignant neoplasm of colon: Secondary | ICD-10-CM | POA: Diagnosis not present

## 2023-06-18 DIAGNOSIS — K219 Gastro-esophageal reflux disease without esophagitis: Secondary | ICD-10-CM | POA: Insufficient documentation

## 2023-06-18 DIAGNOSIS — K635 Polyp of colon: Secondary | ICD-10-CM

## 2023-06-18 HISTORY — PX: BIOPSY: SHX5522

## 2023-06-18 HISTORY — PX: COLONOSCOPY WITH PROPOFOL: SHX5780

## 2023-06-18 SURGERY — COLONOSCOPY WITH PROPOFOL
Anesthesia: General

## 2023-06-18 MED ORDER — SODIUM CHLORIDE 0.9 % IV SOLN
INTRAVENOUS | Status: DC
  Administered 2023-06-18: 1000 mL via INTRAVENOUS

## 2023-06-18 MED ORDER — LIDOCAINE 2% (20 MG/ML) 5 ML SYRINGE
INTRAMUSCULAR | Status: DC | PRN
  Administered 2023-06-18: 20 mg via INTRAVENOUS

## 2023-06-18 MED ORDER — PROPOFOL 500 MG/50ML IV EMUL
INTRAVENOUS | Status: DC | PRN
  Administered 2023-06-18: 120 ug/kg/min via INTRAVENOUS

## 2023-06-18 MED ORDER — PROPOFOL 10 MG/ML IV BOLUS
INTRAVENOUS | Status: DC | PRN
  Administered 2023-06-18: 30 mg via INTRAVENOUS
  Administered 2023-06-18: 70 mg via INTRAVENOUS

## 2023-06-18 NOTE — H&P (Signed)
Ellen Repress, MD [address]  [address]  [place], [place] [address]  Main: [phone]  Fax: [phone] Pager: [phone]  Primary Care Physician:  Ellen Kindle, FNP Primary Gastroenterologist:  Dr. Arlyss Montoya  Pre-Procedure History & Physical: HPI:  Ellen Montoya is a 67 y.o. female is here for an colonoscopy.   Past Medical History:  Diagnosis Date   Acquired trigger finger 09/08/2019   Acute meniscal tear, medial 09/08/2019   Allergy    Arthritis    Cyst of tendon sheath 09/08/2019   GERD (gastroesophageal reflux disease)    HLD (hyperlipidemia)    PONV (postoperative nausea and vomiting)    Routine physical examination 12/17/2016    Past Surgical History:  Procedure Laterality Date   COLONOSCOPY     JOINT REPLACEMENT Left    partial   laprascopy     endometriosis   MENISCUS REPAIR Left    TONSILLECTOMY     TOTAL KNEE REVISION Left 06/17/2022   Procedure: CONVERSION OF UNICOMPARTMENTAL ARTHROPLASTY TO LEFT TOTAL KNEE ARTHROPLASTY;  Surgeon: Ellen Heinz, MD;  Location: [facility] ORS;  Service: Orthopedics;  Laterality: Left;    Prior to Admission medications   Medication Sig Start Date End Date Taking? Authorizing Provider  omeprazole (PRILOSEC) 20 MG capsule Take 20 mg by mouth daily as needed.    [provider]    Allergies as of 04/28/2023 - Review Complete 04/27/2023  Allergen Reaction Noted   Pollen extract-tree extract [pollen extract]  09/08/2019    Family History  Problem Relation Age of Onset   Heart disease Father        thinks a MI   Heart disease Maternal Grandmother    Stroke Maternal Grandmother    Leukemia Mother    Cancer Mother    ADD / ADHD Son    Ovarian cancer Neg Hx    Colon cancer Neg Hx    Breast cancer Neg Hx     Social History   Socioeconomic History   Marital status: Married    Spouse name: Not on file   Number of children: Not on file   Years of education: Not on file   Highest education  level: Not on file  Occupational History   Not on file  Tobacco Use   Smoking status: Never   Smokeless tobacco: Never  Vaping Use   Vaping status: Never Used  Substance and Sexual Activity   Alcohol use: Never   Drug use: Never   Sexual activity: Yes    Birth control/protection: Post-menopausal  Other Topics Concern   Not on file  Social History Narrative   From [place]      Married      3 -children      Work at [employer]      Social Determinants of Health   Financial Resource Strain: Low Risk  (04/05/2023)   Overall Financial Resource Strain (CARDIA)    Difficulty of Paying Living Expenses: Not hard at all  Food Insecurity: No Food Insecurity (04/05/2023)   Hunger Vital Sign    Worried About Running Out of Food in the Last Year: Never true    Ran Out of Food in the Last Year: Never true  Transportation Needs: No Transportation Needs (04/05/2023)   PRAPARE - Administrator, Civil Service (Medical): No    Lack of Transportation (Non-Medical): No  Physical Activity: Sufficiently Active (04/05/2023)   Exercise Vital Sign  Days of Exercise per Week: 7 days    Minutes of Exercise per Session: 60 min  Stress: No Stress Concern Present (04/05/2023)   [employer] of Occupational Health - Occupational Stress Questionnaire    Feeling of Stress : Not at all  Social Connections: Unknown (04/05/2023)   Social Connection and Isolation Panel [NHANES]    Frequency of Communication with Friends and Family: More than three times a week    Frequency of Social Gatherings with Friends and Family: More than three times a week    Attends Religious Services: Not on file    Active Member of Clubs or Organizations: Yes    Attends Banker Meetings: More than 4 times per year    Marital Status: Married  Catering manager Violence: Not At Risk (06/17/2022)   Humiliation, Afraid, Rape, and Kick questionnaire    Fear of Current or Ex-Partner: No     Emotionally Abused: No    Physically Abused: No    Sexually Abused: No    Review of Systems: See HPI, otherwise negative ROS  Physical Exam: BP 130/76   Pulse 79   Temp (!) 97.3 F (36.3 C) (Temporal)   Resp 16   Ht 5\' 3"  (1.6 m)   Wt 61.3 kg   SpO2 99%   BMI 23.93 kg/m  General:   Alert,  pleasant and cooperative in NAD Head:  Normocephalic and atraumatic. Neck:  Supple; no masses or thyromegaly. Lungs:  Clear throughout to auscultation.    Heart:  Regular rate and rhythm. Abdomen:  Soft, nontender and nondistended. Normal bowel sounds, without guarding, and without rebound.   Neurologic:  Alert and  oriented x4;  grossly normal neurologically.  Impression/Plan: Ellen Montoya is here for an colonoscopy to be performed for colon caner screening  Risks, benefits, limitations, and alternatives regarding  colonoscopy have been reviewed with the patient.  Questions have been answered.  All parties agreeable.   Ellen Donath, MD  06/18/2023, 11:02 AM

## 2023-06-18 NOTE — Anesthesia Preprocedure Evaluation (Signed)
Anesthesia Evaluation  Patient identified by MRN, date of birth, ID band Patient awake    Reviewed: Allergy & Precautions, H&P , NPO status , Patient's Chart, lab work & pertinent test results, reviewed documented beta blocker date and time   History of Anesthesia Complications (+) PONV and history of anesthetic complications  Airway Mallampati: III  TM Distance: >3 FB Neck ROM: full    Dental  (+) Dental Advidsory Given, Caps, Implants   Pulmonary neg pulmonary ROS   Pulmonary exam normal breath sounds clear to auscultation       Cardiovascular Exercise Tolerance: Good negative cardio ROS Normal cardiovascular exam Rhythm:regular Rate:Normal     Neuro/Psych negative neurological ROS  negative psych ROS   GI/Hepatic Neg liver ROS,GERD  ,,  Endo/Other  negative endocrine ROS    Renal/GU negative Renal ROS  negative genitourinary   Musculoskeletal   Abdominal   Peds  Hematology negative hematology ROS (+)   Anesthesia Other Findings Past Medical History: 09/08/2019: Acquired trigger finger 09/08/2019: Acute meniscal tear, medial No date: Allergy No date: Arthritis 09/08/2019: Cyst of tendon sheath No date: GERD (gastroesophageal reflux disease) No date: HLD (hyperlipidemia) No date: PONV (postoperative nausea and vomiting) 12/17/2016: Routine physical examination   Reproductive/Obstetrics negative OB ROS                              Anesthesia Physical Anesthesia Plan  ASA: 2  Anesthesia Plan: General   Post-op Pain Management: Minimal or no pain anticipated   Induction: Intravenous  PONV Risk Score and Plan: 3 and Propofol infusion, TIVA and Ondansetron  Airway Management Planned: Nasal Cannula  Additional Equipment: None  Intra-op Plan:   Post-operative Plan:   Informed Consent: I have reviewed the patients History and Physical, chart, labs and discussed the  procedure including the risks, benefits and alternatives for the proposed anesthesia with the patient or authorized representative who has indicated his/her understanding and acceptance.     Dental advisory given  Plan Discussed with: CRNA and Surgeon  Anesthesia Plan Comments: (Discussed risks of anesthesia with patient, including possibility of difficulty with spontaneous ventilation under anesthesia necessitating airway intervention, PONV, and rare risks such as cardiac or respiratory or neurological events, and allergic reactions. Discussed the role of CRNA in patient's perioperative care. Patient understands.)         Anesthesia Quick Evaluation

## 2023-06-18 NOTE — Anesthesia Postprocedure Evaluation (Signed)
Anesthesia Post Note  Patient: Ellen Montoya  Procedure(s) Performed: COLONOSCOPY WITH PROPOFOL BIOPSY  Patient location during evaluation: Endoscopy Anesthesia Type: General Level of consciousness: awake and alert Pain management: pain level controlled Vital Signs Assessment: post-procedure vital signs reviewed and stable Respiratory status: spontaneous breathing, nonlabored ventilation, respiratory function stable and patient connected to nasal cannula oxygen Cardiovascular status: blood pressure returned to baseline and stable Postop Assessment: no apparent nausea or vomiting Anesthetic complications: no  No notable events documented.   Last Vitals:  Vitals:   06/18/23 1142 06/18/23 1152  BP: 105/65 126/81  Pulse:    Resp: 20 16  Temp:    SpO2:  98%    Last Pain:  Vitals:   06/18/23 1152  TempSrc:   PainSc: 0-No pain                 Stephanie Coup

## 2023-06-18 NOTE — Op Note (Signed)
[facility]astroenterology Patient Name: Ellen Montoya Procedure Date: 06/18/2023 11:06 AM MRN: [id number] Account #: [id number] Date of Birth: Nov 02, 1955 Admit Type: Outpatient Age: 67 Room: [facility] ENDO ROOM 2 Gender: Female Note Status: Finalized Instrument Name: Peds Colonoscope [id number] Procedure:             Colonoscopy Indications:           Screening for colorectal malignant neoplasm, Last                         colonoscopy 10 years ago Providers:             Toney Reil MD, MD Referring MD:          Daryl Eastern. Suzie Portela (Referring MD) Medicines:             General Anesthesia Complications:         No immediate complications. Estimated blood loss: None. Procedure:             Pre-Anesthesia Assessment:                        - Prior to the procedure, a History and Physical was                         performed, and patient medications and allergies were                         reviewed. The patient is competent. The risks and                         benefits of the procedure and the sedation options and                         risks were discussed with the patient. All questions                         were answered and informed consent was obtained.                         Patient identification and proposed procedure were                         verified by the physician, the nurse, the                         anesthesiologist, the anesthetist and the technician                         in the pre-procedure area in the procedure room in the                         endoscopy suite. Mental Status Examination: alert and                         oriented. Airway Examination: normal oropharyngeal                         airway and neck mobility. Respiratory Examination:  clear to auscultation. CV Examination: normal.                         Prophylactic Antibiotics: The patient does not require                         prophylactic  antibiotics. Prior Anticoagulants: The                         patient has taken no anticoagulant or antiplatelet                         agents. ASA Grade Assessment: II - A patient with mild                         systemic disease. After reviewing the risks and                         benefits, the patient was deemed in satisfactory                         condition to undergo the procedure. The anesthesia                         plan was to use general anesthesia. Immediately prior                         to administration of medications, the patient was                         re-assessed for adequacy to receive sedatives. The                         heart rate, respiratory rate, oxygen saturations,                         blood pressure, adequacy of pulmonary ventilation, and                         response to care were monitored throughout the                         procedure. The physical status of the patient was                         re-assessed after the procedure.                        After obtaining informed consent, the colonoscope was                         passed under direct vision. Throughout the procedure,                         the patient's blood pressure, pulse, and oxygen                         saturations were monitored continuously. The  Colonoscope was introduced through the anus and                         advanced to the the cecum, identified by appendiceal                         orifice and ileocecal valve. The colonoscopy was                         performed without difficulty. The patient tolerated                         the procedure well. The quality of the bowel                         preparation was evaluated using the BBPS [facility] Bowel                         Preparation Scale) with scores of: Right Colon = 3,                         Transverse Colon = 3 and Left Colon = 3 (entire mucosa                         seen  well with no residual staining, small fragments                         of stool or opaque liquid). The total BBPS score                         equals 9. The ileocecal valve, appendiceal orifice,                         and rectum were photographed. Findings:      The perianal and digital rectal examinations were normal. Pertinent       negatives include normal sphincter tone and no palpable rectal lesions.      Four sessile polyps were found in the descending colon and ascending       colon. The polyps were 4 to 5 mm in size. These polyps were removed with       a cold snare. Resection and retrieval were complete. Estimated blood       loss: none.      The retroflexed view of the distal rectum and anal verge was normal and       showed no anal or rectal abnormalities. Impression:            - Four 4 to 5 mm polyps in the descending colon and in                         the ascending colon, removed with a cold snare.                         Resected and retrieved.                        - The distal rectum and anal verge are normal on  retroflexion view. Recommendation:        - Discharge patient to home (with escort).                        - Resume previous diet today.                        - Continue present medications.                        - Await pathology results.                        - Repeat colonoscopy in 3 - 5 years for surveillance                         based on pathology results. Procedure Code(s):     --- Professional ---                        [phone], Colonoscopy, flexible; with removal of                         tumor(s), polyp(s), or other lesion(s) by snare                         technique Diagnosis Code(s):     --- Professional ---                        Z12.11, Encounter for screening for malignant neoplasm                         of colon                        D12.4, Benign neoplasm of descending colon                        D12.2,  Benign neoplasm of ascending colon CPT copyright 2022 American Medical Association. All rights reserved. The codes documented in this report are preliminary and upon coder review may  be revised to meet current compliance requirements. Dr. Libby Maw Toney Reil MD, MD 06/18/2023 11:31:26 AM This report has been signed electronically. Number of Addenda: 0 Note Initiated On: 06/18/2023 11:06 AM Scope Withdrawal Time: 0 hours 9 minutes 22 seconds  Total Procedure Duration: 0 hours 13 minutes 39 seconds  Estimated Blood Loss:  Estimated blood loss: none.      [facility]

## 2023-06-18 NOTE — Transfer of Care (Signed)
Immediate Anesthesia Transfer of Care Note  Patient: Ellen Montoya  Procedure(s) Performed: COLONOSCOPY WITH PROPOFOL BIOPSY  Patient Location: Endoscopy Unit  Anesthesia Type:General  Level of Consciousness: awake  Airway & Oxygen Therapy: Patient Spontanous Breathing  Post-op Assessment: Report given to RN and Post -op Vital signs reviewed and stable  Post vital signs: Reviewed  Last Vitals:  Vitals Value Taken Time  BP    Temp    Pulse    Resp    SpO2      Last Pain:  Vitals:   06/18/23 1040  TempSrc: Temporal  PainSc: 0-No pain         Complications: No notable events documented.

## 2023-06-21 ENCOUNTER — Encounter: Payer: Self-pay | Admitting: Gastroenterology

## 2023-06-21 LAB — SURGICAL PATHOLOGY

## 2023-06-24 ENCOUNTER — Encounter: Payer: Self-pay | Admitting: Gastroenterology

## 2023-07-03 ENCOUNTER — Other Ambulatory Visit: Payer: Self-pay | Admitting: Family Medicine

## 2023-07-03 DIAGNOSIS — E7800 Pure hypercholesterolemia, unspecified: Secondary | ICD-10-CM

## 2023-07-05 NOTE — Telephone Encounter (Signed)
Requested Prescriptions  Refused Prescriptions Disp Refills   rosuvastatin (CRESTOR) 10 MG tablet [Pharmacy Med Name: ROSUVASTATIN CALCIUM 10 MG TAB] 90 tablet 0    Sig: TAKE 1 TABLET BY MOUTH EVERY DAY IN THE EVENING     Cardiovascular:  Antilipid - Statins 2 Failed - 07/03/2023  8:32 AM      Failed - Lipid Panel in normal range within the last 12 months    Cholesterol, Total  Date Value Ref Range Status  04/26/2023 191 100 - 199 mg/dL Final   LDL Chol Calc (NIH)  Date Value Ref Range Status  04/26/2023 103 (H) 0 - 99 mg/dL Final   HDL  Date Value Ref Range Status  04/26/2023 51 >39 mg/dL Final   Triglycerides  Date Value Ref Range Status  04/26/2023 216 (H) 0 - 149 mg/dL Final         Passed - Cr in normal range and within 360 days    Creatinine, Ser  Date Value Ref Range Status  04/26/2023 0.76 0.57 - 1.00 mg/dL Final         Passed - Patient is not pregnant      Passed - Valid encounter within last 12 months    Recent Outpatient Visits           2 months ago Annual physical exam   [facility] Jacky Kindle, FNP   1 year ago Annual physical exam   [facility] Jacky Kindle, FNP   2 years ago Encounter for subsequent annual wellness visit (AWV) in Medicare patient   [facility] Merita Norton T, FNP   3 years ago Fever, unspecified fever cause   [facility] Trey Sailors, [place]   3 years ago Annual physical exam   [facility] Trey Sailors, [place]       Future Appointments             In 9 months Jacky Kindle, FNP [facility]

## 2023-08-10 ENCOUNTER — Other Ambulatory Visit: Payer: Self-pay | Admitting: Family Medicine

## 2023-08-10 DIAGNOSIS — Z1231 Encounter for screening mammogram for malignant neoplasm of breast: Secondary | ICD-10-CM

## 2023-09-01 ENCOUNTER — Ambulatory Visit
Admission: RE | Admit: 2023-09-01 | Discharge: 2023-09-01 | Disposition: A | Discharge status: Home / Self Care | Payer: PPO | Source: Ambulatory Visit | Attending: Family Medicine | Admitting: Family Medicine

## 2023-09-01 DIAGNOSIS — Z1231 Encounter for screening mammogram for malignant neoplasm of breast: Secondary | ICD-10-CM | POA: Diagnosis present

## 2024-04-18 ENCOUNTER — Ambulatory Visit: Payer: PPO

## 2024-04-26 ENCOUNTER — Encounter: Payer: PPO | Admitting: Family Medicine

## 2024-05-03 ENCOUNTER — Encounter

## 2024-05-26 ENCOUNTER — Other Ambulatory Visit: Payer: Self-pay | Admitting: Medical Genetics

## 2024-05-29 ENCOUNTER — Ambulatory Visit (INDEPENDENT_AMBULATORY_CARE_PROVIDER_SITE_OTHER): Admitting: Family Medicine

## 2024-05-29 ENCOUNTER — Encounter: Payer: Self-pay | Admitting: Family Medicine

## 2024-05-29 VITALS — BP 128/75 | HR 78 | Ht 63.0 in | Wt 139.0 lb

## 2024-05-29 DIAGNOSIS — Z1329 Encounter for screening for other suspected endocrine disorder: Secondary | ICD-10-CM

## 2024-05-29 DIAGNOSIS — Z79899 Other long term (current) drug therapy: Secondary | ICD-10-CM

## 2024-05-29 DIAGNOSIS — K219 Gastro-esophageal reflux disease without esophagitis: Secondary | ICD-10-CM | POA: Diagnosis not present

## 2024-05-29 DIAGNOSIS — L659 Nonscarring hair loss, unspecified: Secondary | ICD-10-CM | POA: Diagnosis not present

## 2024-05-29 DIAGNOSIS — M8580 Other specified disorders of bone density and structure, unspecified site: Secondary | ICD-10-CM

## 2024-05-29 DIAGNOSIS — R7303 Prediabetes: Secondary | ICD-10-CM

## 2024-05-29 DIAGNOSIS — Z136 Encounter for screening for cardiovascular disorders: Secondary | ICD-10-CM

## 2024-05-29 DIAGNOSIS — Z0001 Encounter for general adult medical examination with abnormal findings: Secondary | ICD-10-CM | POA: Diagnosis not present

## 2024-05-29 DIAGNOSIS — D5911 Warm autoimmune hemolytic anemia: Secondary | ICD-10-CM

## 2024-05-29 DIAGNOSIS — Z0000 Encounter for general adult medical examination without abnormal findings: Secondary | ICD-10-CM

## 2024-05-29 DIAGNOSIS — H8110 Benign paroxysmal vertigo, unspecified ear: Secondary | ICD-10-CM

## 2024-05-29 NOTE — Progress Notes (Signed)
 Complete physical exam   Patient: Ellen Montoya   DOB: 1956-09-16   68 y.o. Female  MRN: [id number] Visit Date: 05/29/2024  Today's healthcare provider: LAURAINE LOISE BUOY, DO   Chief Complaint  Patient presents with   Transitions Of Care    Possible physical    Subjective    Ellen Montoya is a 68 y.o. female who presents today for a complete physical exam.   HPI HPI     Transitions Of Care    Additional comments: Possible physical       Last edited by Thelbert Eulalio HERO, CMA on 05/29/2024  2:15 PM.      Ellen Montoya is a 68 year old female who presents for an annual physical exam.  She experiences occasional burning sensations, uncertain if they are due to heartburn or reflux. She uses omeprazole a couple of times a week and Rolaids for quick relief. The symptoms recur, and she is considering more regular use of omeprazole. No chest pain, shortness of breath, or palpitations.  She experiences occasional dizziness, which she attributes to a history of vertigo. The dizziness is triggered by certain movements, such as flipping over in bed or moving the wrong way at work. No headaches, double vision, or blurry vision.  She has a history of knee replacement and reports occasional knee and hip pain, which she attributes to possible strains. She engages in Pilates three times a week, which she feels helps strengthen her muscles. No swelling in her legs.  She is concerned about rapid hair thinning, particularly noticeable on the top of her head, as noted by her hairdresser. She has not experienced any recent stress or changes that might explain the hair loss. She mentions a past suggestion to check her thyroid .  She has a history of recurrent bronchitis during the winter, which she used to treat at urgent care. She notes that she did not experience bronchitis last year after having it annually post-COVID. No fever, chills, runny nose, sore throat, or ear pain.  She recalls  receiving a letter from the [facility] about warm antibodies in her blood, which prevents her from donating blood. She has no known history of anemia and has not experienced any issues related to this finding.    Past Medical History:  Diagnosis Date   Acquired trigger finger 09/08/2019   Acute meniscal tear, medial 09/08/2019   Allergy    Arthritis    Cyst of tendon sheath 09/08/2019   GERD (gastroesophageal reflux disease)    HLD (hyperlipidemia)    PONV (postoperative nausea and vomiting)    Routine physical examination 12/17/2016   Past Surgical History:  Procedure Laterality Date   BIOPSY  06/18/2023   Procedure: BIOPSY;  Surgeon: Unk Corinn Skiff, MD;  Location: [facility] ENDOSCOPY;  Service: Gastroenterology;;   COLONOSCOPY     COLONOSCOPY WITH PROPOFOL  N/A 06/18/2023   Procedure: COLONOSCOPY WITH PROPOFOL ;  Surgeon: Unk Corinn Skiff, MD;  Location: [facility] ENDOSCOPY;  Service: Gastroenterology;  Laterality: N/A;   JOINT REPLACEMENT Left    partial   laprascopy     endometriosis   MENISCUS REPAIR Left    TONSILLECTOMY     TOTAL KNEE REVISION Left 06/17/2022   Procedure: CONVERSION OF UNICOMPARTMENTAL ARTHROPLASTY TO LEFT TOTAL KNEE ARTHROPLASTY;  Surgeon: Mardee Lynwood SQUIBB, MD;  Location: [facility] ORS;  Service: Orthopedics;  Laterality: Left;   Social History   Socioeconomic History   Marital status: Married    Spouse  name: Not on file   Number of children: Not on file   Years of education: Not on file   Highest education level: Associate degree: occupational, Scientist, product/process development, or vocational program  Occupational History   Not on file  Tobacco Use   Smoking status: Never   Smokeless tobacco: Never  Vaping Use   Vaping status: Never Used  Substance and Sexual Activity   Alcohol use: Never   Drug use: Never   Sexual activity: Yes    Birth control/protection: Post-menopausal  Other Topics Concern   Not on file  Social History Narrative   From [place]      Married       3 -children      Work at [employer]      Social Drivers of Health   Financial Resource Strain: Low Risk  (04/17/2024)   Overall Financial Resource Strain (CARDIA)    Difficulty of Paying Living Expenses: Not hard at all  Food Insecurity: No Food Insecurity (04/17/2024)   Hunger Vital Sign    Worried About Running Out of Food in the Last Year: Never true    Ran Out of Food in the Last Year: Never true  Transportation Needs: No Transportation Needs (04/17/2024)   PRAPARE - Administrator, Civil Service (Medical): No    Lack of Transportation (Non-Medical): No  Physical Activity: Sufficiently Active (04/17/2024)   Exercise Vital Sign    Days of Exercise per Week: 4 days    Minutes of Exercise per Session: 60 min  Stress: No Stress Concern Present (04/17/2024)   [employer] of Occupational Health - Occupational Stress Questionnaire    Feeling of Stress: Not at all  Social Connections: Socially Integrated (04/17/2024)   Social Connection and Isolation Panel    Frequency of Communication with Friends and Family: More than three times a week    Frequency of Social Gatherings with Friends and Family: More than three times a week    Attends Religious Services: More than 4 times per year    Active Member of [employer] or Organizations: Yes    Attends Banker Meetings: More than 4 times per year    Marital Status: Married  Catering manager Violence: Not At Risk (06/17/2022)   Humiliation, Afraid, Rape, and Kick questionnaire    Fear of Current or Ex-Partner: No    Emotionally Abused: No    Physically Abused: No    Sexually Abused: No   Family Status  Relation Name Status   Mother Virginia  Myrna Deceased   Father Ethan Myrna Deceased   Son Worth (Not Specified)   MGM Nuala [place] (Not Specified)   Neg Hx  (Not Specified)  No partnership data on file   Family History  Problem Relation Age of Onset   Leukemia Mother    Cancer Mother     Heart disease Father        thinks a MI   ADD / ADHD Son    Heart disease Maternal Grandmother    Stroke Maternal Grandmother    Ovarian cancer Neg Hx    Colon cancer Neg Hx    Breast cancer Neg Hx    Allergies  Allergen Reactions   Pollen Extract-Tree Extract [Pollen Extract]     Patient Care Team: Donzella Lauraine SAILOR, DO as PCP - General (Family Medicine)   Medications: Outpatient Medications Prior to Visit  Medication Sig   Naproxen Sodium (ALEVE) 220 MG CAPS    omeprazole (PRILOSEC)  20 MG capsule Take 20 mg by mouth daily as needed.   No facility-administered medications prior to visit.    Review of Systems  Constitutional:  Negative for chills, fatigue and fever.  HENT:  Negative for congestion, ear pain, rhinorrhea, sneezing and sore throat.   Eyes: Negative.  Negative for pain, redness and visual disturbance.  Respiratory:  Negative for cough, shortness of breath and wheezing.   Cardiovascular:  Negative for chest pain and leg swelling.  Gastrointestinal:  Negative for abdominal pain, blood in stool, constipation, diarrhea and nausea.  Endocrine: Negative for polydipsia and polyphagia.       +hair thinning  Genitourinary: Negative.  Negative for dysuria, flank pain, hematuria, pelvic pain, vaginal bleeding and vaginal discharge.  Musculoskeletal:  Negative for arthralgias, back pain, gait problem and joint swelling.  Skin:  Negative for rash.  Neurological:  Positive for dizziness (occasional; not frequent.). Negative for tremors, seizures, weakness, light-headedness, numbness and headaches.  Hematological:  Negative for adenopathy.  Psychiatric/Behavioral: Negative.  Negative for behavioral problems, confusion and dysphoric mood. The patient is not nervous/anxious and is not hyperactive.       Objective    BP 128/75 (BP Location: Left Arm, Patient Position: Sitting, Cuff Size: Normal)   Pulse 78   Ht 5' 3 (1.6 m)   Wt 139 lb (63 kg)   SpO2 98%   BMI 24.62 kg/m     Physical Exam Vitals and nursing note reviewed.  Constitutional:      General: She is awake.     Appearance: Normal appearance.  HENT:     Head: Normocephalic and atraumatic.     Right Ear: Tympanic membrane, ear canal and external ear normal.     Left Ear: Tympanic membrane, ear canal and external ear normal.     Nose: Nose normal.     Mouth/Throat:     Mouth: Mucous membranes are moist.     Pharynx: Oropharynx is clear. No oropharyngeal exudate or posterior oropharyngeal erythema.  Eyes:     General: No scleral icterus.    Extraocular Movements: Extraocular movements intact.     Conjunctiva/sclera: Conjunctivae normal.     Pupils: Pupils are equal, round, and reactive to light.  Neck:     Thyroid : No thyromegaly or thyroid  tenderness.  Cardiovascular:     Rate and Rhythm: Normal rate and regular rhythm.     Pulses: Normal pulses.     Heart sounds: Normal heart sounds.  Pulmonary:     Effort: Pulmonary effort is normal. No tachypnea, bradypnea or respiratory distress.     Breath sounds: Normal breath sounds. No stridor. No wheezing, rhonchi or rales.  Abdominal:     General: Bowel sounds are normal. There is no distension.     Palpations: Abdomen is soft. There is no mass.     Tenderness: There is no abdominal tenderness. There is no guarding.     Hernia: No hernia is present.  Musculoskeletal:     Cervical back: Normal range of motion and neck supple.     Right lower leg: No edema.     Left lower leg: No edema.  Lymphadenopathy:     Cervical: No cervical adenopathy.  Skin:    General: Skin is warm and dry.  Neurological:     Mental Status: She is alert and oriented to person, place, and time. Mental status is at baseline.  Psychiatric:        Mood and Affect: Mood normal.  Behavior: Behavior normal.      Last depression screening scores    05/29/2024    2:19 PM 04/06/2023    8:52 AM 04/22/2022   10:44 AM  PHQ 2/9 Scores  PHQ - 2 Score 0 0 0  PHQ- 9  Score 0  0   Last fall risk screening    05/29/2024    3:05 PM  Fall Risk   Falls in the past year? 0  Number falls in past yr: 0  Injury with Fall? 0  Risk for fall due to : No Fall Risks   Last Audit-C alcohol use screening    05/29/2024    3:05 PM  Alcohol Use Disorder Test (AUDIT)  1. How often do you have a drink containing alcohol? 0  2. How many drinks containing alcohol do you have on a typical day when you are drinking? 0  3. How often do you have six or more drinks on one occasion? 0  AUDIT-C Score 0   A score of 3 or more in women, and 4 or more in men indicates increased risk for alcohol abuse, EXCEPT if all of the points are from question 1   Results for orders placed or performed in visit on 05/29/24  Comprehensive metabolic panel with GFR  Result Value Ref Range   Glucose 94 70 - 99 mg/dL   BUN 13 8 - 27 mg/dL   Creatinine, Ser 9.20 0.57 - 1.00 mg/dL   eGFR 80 >40 fO/fpw/8.26   BUN/Creatinine Ratio 16 12 - 28   Sodium 142 134 - 144 mmol/L   Potassium 4.3 3.5 - 5.2 mmol/L   Chloride 104 96 - 106 mmol/L   CO2 22 20 - 29 mmol/L   Calcium  9.3 8.7 - 10.3 mg/dL   Total Protein 6.8 6.0 - 8.5 g/dL   Albumin 4.5 3.9 - 4.9 g/dL   Globulin, Total 2.3 1.5 - 4.5 g/dL   Bilirubin Total 0.9 0.0 - 1.2 mg/dL   Alkaline Phosphatase 80 44 - 121 IU/L   AST 17 0 - 40 IU/L   ALT 11 0 - 32 IU/L  Hemoglobin A1c  Result Value Ref Range   Hgb A1c MFr Bld 5.5 4.8 - 5.6 %   Est. average glucose Bld gHb Est-mCnc 111 mg/dL  Lipid panel  Result Value Ref Range   Cholesterol, Total 211 (H) 100 - 199 mg/dL   Triglycerides 887 0 - 149 mg/dL   HDL 56 >60 mg/dL   VLDL Cholesterol Cal 20 5 - 40 mg/dL   LDL Chol Calc (NIH) 864 (H) 0 - 99 mg/dL   Chol/HDL Ratio 3.8 0.0 - 4.4 ratio  VITAMIN D  25 Hydroxy (Vit-D Deficiency, Fractures)  Result Value Ref Range   Vit D, 25-Hydroxy 25.8 (L) 30.0 - 100.0 ng/mL  TSH Rfx on Abnormal to Free T4  Result Value Ref Range   TSH 1.730 0.450 - 4.500  uIU/mL  Vitamin B12  Result Value Ref Range   Vitamin B-12 573 232 - 1,245 pg/mL  Ferritin  Result Value Ref Range   Ferritin 65 15 - 150 ng/mL  Iron Binding Cap (TIBC)([facility])  Result Value Ref Range   Total Iron Binding Capacity 207 (L) 250 - 450 ug/dL   UIBC 875 881 - 630 ug/dL   Iron 83 27 - 860 ug/dL   Iron Saturation 40 15 - 55 %  CBC with Differential/Platelet  Result Value Ref Range   WBC 4.9 3.4 - 10.8 x10E3/uL  RBC 4.51 3.77 - 5.28 x10E6/uL   Hemoglobin 13.5 11.1 - 15.9 g/dL   Hematocrit 58.3 65.9 - 46.6 %   MCV 92 79 - 97 fL   MCH 29.9 26.6 - 33.0 pg   MCHC 32.5 31.5 - 35.7 g/dL   RDW 87.5 88.2 - 84.5 %   Platelets 237 150 - 450 x10E3/uL   Neutrophils 61 Not Estab. %   Lymphs 24 Not Estab. %   Monocytes 10 Not Estab. %   Eos 4 Not Estab. %   Basos 1 Not Estab. %   Neutrophils Absolute 3.0 1.4 - 7.0 x10E3/uL   Lymphocytes Absolute 1.2 0.7 - 3.1 x10E3/uL   Monocytes Absolute 0.5 0.1 - 0.9 x10E3/uL   EOS (ABSOLUTE) 0.2 0.0 - 0.4 x10E3/uL   Basophils Absolute 0.1 0.0 - 0.2 x10E3/uL   Immature Granulocytes 0 Not Estab. %   Immature Grans (Abs) 0.0 0.0 - 0.1 x10E3/uL  Testosterone   Result Value Ref Range   Testosterone  17 3 - 67 ng/dL  DHEA-sulfate  Result Value Ref Range   DHEA-SO4 80.3 20.4 - 186.6 ug/dL    Assessment & Plan    Routine Health Maintenance and Physical Exam  Exercise Activities and Dietary recommendations  Goals      Manage Pain     Evidence-based guidance:  Develop a mutual, multimodal pain management plan with patient and caregiver; determine person's knowledge of the condition, any concerns or fears, personal preferences and ability to access services.  Assess pain level, treatment efficacy and patient response at regular intervals using a consistent pain scale; review the effectiveness and tolerability of all treatments.  Correlate pain with impact on daily activities, occupation, mood, sleep quality, comorbidities, other  (nonarthritic) pain, fall risk and quality of life.  Encourage nonpharmacologic measures, such as applied heat or cold, exercise, physical or occupational therapy, orthoses, cognitive behavioral therapy, yoga, tai-chi, acupuncture, adequate sleep and weight loss.  Provide anticipatory guidance regarding benefits to multimodal treatment that improves quality of life.  Consider the use of transcutaneous TENS (electrical nerve stimulation), shock-absorbing footwear, orthoses, mineral baths, nutraceuticals or electromagnetic-field therapy.  Prepare patient for individualized pharmacologic pain management in a stepped approach based on presentation, risk and comorbidities; monitor and manage side effects and anticipate periodic adjustments.  Promote individualized plan to stay active when unable to participate in physical therapy, such as passive and active range of motion, yoga, walking, water aerobics or swimming.  Support and optimize psychosocial response to pain.   Notes: pt has right knee pain, she will manage with orthopedic MD.        Immunization History  Administered Date(s) Administered   Influenza,inj,Quad PF,6+ Mos 12/17/2016, 10/16/2018   Influenza-Unspecified 06/19/2017   PFIZER(Purple Top)SARS-COV-2 Vaccination 11/13/2019, 12/04/2019   Pneumococcal Conjugate-13 05/21/2020   Pneumococcal Polysaccharide-23 05/22/2021   Tdap 05/22/2021   Zoster Recombinant(Shingrix) 08/29/2023, 11/14/2023    Health Maintenance  Topic Date Due   Medicare Annual Wellness (AWV)  04/25/2024   Influenza Vaccine  12/19/2024 (Originally 04/21/2024)   COVID-19 Vaccine (3 - Pfizer risk series) 05/22/2025 (Originally 01/01/2020)   DEXA SCAN  07/30/2025   Mammogram  08/31/2025   Colonoscopy  06/17/2026   DTaP/Tdap/Td (2 - Td or Tdap) 05/23/2031   Pneumococcal Vaccine: 50+ Years  Completed   Hepatitis C Screening  Completed   Zoster Vaccines- Shingrix  Completed   HPV VACCINES  Aged Out   Meningococcal  B Vaccine  Aged Out    Discussed health benefits of physical activity,  and encouraged her to engage in regular exercise appropriate for her age and condition.   Annual physical exam  Gastroesophageal reflux disease without esophagitis -     Vitamin B12  High risk medication use -     Vitamin B12  Hair thinning -     VITAMIN D  25 Hydroxy (Vit-D Deficiency, Fractures) -     TSH Rfx on Abnormal to Free T4 -     Ferritin -     Iron -     Iron and TIBC -     Testosterone  -     DHEA-sulfate  Warm reactive antibody (HCC) -     Ferritin -     Iron -     Iron and TIBC -     CBC with Differential/Platelet  Osteopenia, unspecified location  Benign paroxysmal positional vertigo, unspecified laterality  Prediabetes -     Hemoglobin A1c  Screening for endocrine, metabolic and immunity disorder -     Comprehensive metabolic panel with GFR  Encounter for screening for cardiovascular disorders -     Lipid panel     Annual physical exam Routine annual wellness visit with no acute concerns.  Physical exam overall unremarkable except as noted above. Routine lab work ordered as noted.  - Perform routine physical examination. - Discuss general health maintenance and screenings.  Gastroesophageal reflux disease Intermittent burning sensation managed with omeprazole and Rolaids. Attempting regular omeprazole use to prevent recurrence. - Continue omeprazole as needed. - Use Rolaids for quick symptom relief.  Hair thinning Rapid hair thinning at scalp's top. Discussed minoxidil use; prefers to await lab results before treatment decision. - Order complete blood count, iron panel, thyroid  levels, vitamin D , B12, testosterone , and DHEA. - Consider dermatology referral for scalp biopsy if needed.  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 Hematology consultation offered. - Order complete blood count to check for anemia. - Offer  referral to hematology if further evaluation is desired.  Osteopenia Osteopenia identified from 2021 bone density test. Increasing vitamin and calcium  intake. - Encourage continued intake of vitamins and calcium .  Benign paroxysmal positional vertigo Occasional dizziness due to benign paroxysmal positional vertigo, infrequent and mild.    Return in about 1 year (around 05/29/2025) for CPE.     I discussed the assessment and treatment plan with the patient  The patient was provided an opportunity to ask questions and all were answered. The patient agreed with the plan and demonstrated an understanding of the instructions.   The patient was advised to call back or seek an in-person evaluation if the symptoms worsen or if the condition fails to improve as anticipated.    LAURAINE LOISE BUOY, DO  [facility] [phone] (phone) [phone] (fax)  [facility]

## 2024-05-30 ENCOUNTER — Other Ambulatory Visit
Admission: RE | Admit: 2024-05-30 | Discharge: 2024-05-30 | Disposition: A | Discharge status: Home / Self Care | Payer: Self-pay | Source: Ambulatory Visit | Attending: Medical Genetics | Admitting: Medical Genetics

## 2024-05-31 LAB — LIPID PANEL
Chol/HDL Ratio: 3.8 ratio (ref 0.0–4.4)
Cholesterol, Total: 211 mg/dL — ABNORMAL HIGH (ref 100–199)
HDL: 56 mg/dL (ref >39)
LDL Chol Calc (NIH): 135 mg/dL — ABNORMAL HIGH (ref 0–99)
Triglycerides: 112 mg/dL (ref 0–149)
VLDL Cholesterol Cal: 20 mg/dL (ref 5–40)

## 2024-05-31 LAB — VITAMIN D 25 HYDROXY (VIT D DEFICIENCY, FRACTURES): Vit D, 25-Hydroxy: 25.8 ng/mL — ABNORMAL LOW (ref 30.0–100.0)

## 2024-05-31 LAB — CBC WITH DIFFERENTIAL/PLATELET
Basophils Absolute: 0.1 x10E3/uL (ref 0.0–0.2)
Basos: 1 %
EOS (ABSOLUTE): 0.2 x10E3/uL (ref 0.0–0.4)
Eos: 4 %
Hematocrit: 41.6 % (ref 34.0–46.6)
Hemoglobin: 13.5 g/dL (ref 11.1–15.9)
Immature Grans (Abs): 0 x10E3/uL (ref 0.0–0.1)
Immature Granulocytes: 0 %
Lymphocytes Absolute: 1.2 x10E3/uL (ref 0.7–3.1)
Lymphs: 24 %
MCH: 29.9 pg (ref 26.6–33.0)
MCHC: 32.5 g/dL (ref 31.5–35.7)
MCV: 92 fL (ref 79–97)
Monocytes Absolute: 0.5 x10E3/uL (ref 0.1–0.9)
Monocytes: 10 %
Neutrophils Absolute: 3 x10E3/uL (ref 1.4–7.0)
Neutrophils: 61 %
Platelets: 237 x10E3/uL (ref 150–450)
RBC: 4.51 x10E6/uL (ref 3.77–5.28)
RDW: 12.4 % (ref 11.7–15.4)
WBC: 4.9 x10E3/uL (ref 3.4–10.8)

## 2024-05-31 LAB — VITAMIN B12: Vitamin B-12: 573 pg/mL (ref 232–1245)

## 2024-05-31 LAB — HEMOGLOBIN A1C
Est. average glucose Bld gHb Est-mCnc: 111 mg/dL
Hgb A1c MFr Bld: 5.5 % (ref 4.8–5.6)

## 2024-05-31 LAB — COMPREHENSIVE METABOLIC PANEL WITH GFR
ALT: 11 IU/L (ref 0–32)
AST: 17 IU/L (ref 0–40)
Albumin: 4.5 g/dL (ref 3.9–4.9)
Alkaline Phosphatase: 80 IU/L (ref 44–121)
BUN/Creatinine Ratio: 16 (ref 12–28)
BUN: 13 mg/dL (ref 8–27)
Bilirubin Total: 0.9 mg/dL (ref 0.0–1.2)
CO2: 22 mmol/L (ref 20–29)
Calcium: 9.3 mg/dL (ref 8.7–10.3)
Chloride: 104 mmol/L (ref 96–106)
Creatinine, Ser: 0.79 mg/dL (ref 0.57–1.00)
Globulin, Total: 2.3 g/dL (ref 1.5–4.5)
Glucose: 94 mg/dL (ref 70–99)
Potassium: 4.3 mmol/L (ref 3.5–5.2)
Sodium: 142 mmol/L (ref 134–144)
Total Protein: 6.8 g/dL (ref 6.0–8.5)
eGFR: 80 mL/min/1.73 (ref >59)

## 2024-05-31 LAB — IRON AND TIBC
Iron Saturation: 40 % (ref 15–55)
Iron: 83 ug/dL (ref 27–139)
Total Iron Binding Capacity: 207 ug/dL — ABNORMAL LOW (ref 250–450)
UIBC: 124 ug/dL (ref 118–369)

## 2024-05-31 LAB — TESTOSTERONE: Testosterone: 17 ng/dL (ref 3–67)

## 2024-05-31 LAB — FERRITIN: Ferritin: 65 ng/mL (ref 15–150)

## 2024-05-31 LAB — DHEA-SULFATE: DHEA-SO4: 80.3 ug/dL (ref 20.4–186.6)

## 2024-05-31 LAB — TSH RFX ON ABNORMAL TO FREE T4: TSH: 1.73 u[IU]/mL (ref 0.450–4.500)

## 2024-06-06 LAB — GENECONNECT MOLECULAR SCREEN: Genetic Analysis Overall Interpretation: NEGATIVE

## 2024-06-07 ENCOUNTER — Ambulatory Visit: Payer: Self-pay | Admitting: Family Medicine

## 2024-08-28 ENCOUNTER — Ambulatory Visit: Payer: Self-pay

## 2024-08-28 NOTE — Telephone Encounter (Signed)
 Needs appointment for dizziness or go to urgent care.

## 2024-08-28 NOTE — Telephone Encounter (Signed)
 Called patient no answer. Okay for E2C2 to relay message to patient.

## 2024-08-28 NOTE — Telephone Encounter (Signed)
 FYI Only or Action Required?: Action required by provider: requesting medication for vertigo symptoms. Asking for a call back.  Patient was last seen in primary care on 05/29/2024 by Donzella Lauraine SAILOR, DO.  Called Nurse Triage reporting Dizziness.  Symptoms began 3 weeks ago.  Interventions attempted: Rest, hydration, or home remedies.  Symptoms are: unchanged.  Triage Disposition: See PCP When Office is Open (Within 3 Days)  Patient/caregiver understands and will follow disposition?: No, wishes to speak with PCP  Copied from CRM #[id number]. Topic: Clinical - Red Word Triage >> Aug 28, 2024  9:24 AM Ellen Montoya wrote: Red Word that prompted transfer to Nurse Triage: Patient is having the following symptoms:  - Dizziness off and on for the last 3 weeks.  - Ear is clogged - Left Has been taking pseudofed and Dramamine but nothing is helping   **Transf. To NT** Reason for Disposition  [1] MODERATE dizziness (e.g., vertigo; feels very unsteady, interferes with normal activities) AND [2] has been evaluated by doctor (or NP/PA) for this  Answer Assessment - Initial Assessment Questions Patient reports moderate symptoms of vertigo. Patient reports she is having a hard time moving her head up and down. Endorses nausea and her right ear being clogged. No other symptoms. Patient is asking if medication can be sent to her pharmacy. Patient is asking for a call back from office.   1. DESCRIPTION: Describe your dizziness.     Patient reports she is having a hard time moving her head up and down. Patient reports this has been happening when she has had vertigo 2. VERTIGO: Do you feel like either you or the room is spinning or tilting?      yes 3. LIGHTHEADED: Do you feel lightheaded? (e.g., somewhat faint, woozy, weak upon standing)     no 4. SEVERITY: How bad is it?  Can you walk?     moderate 5. ONSET:  When did the dizziness begin?     Three weeks-has been going on intermittent 6.  AGGRAVATING FACTORS: Does anything make it worse? (e.g., standing, change in head position)     Change in head position 7. CAUSE: What do you think is causing the dizziness?     unsure 8. RECURRENT SYMPTOM: Have you had dizziness before? If Yes, ask: When was the last time? What happened that time?     yes 9. OTHER SYMPTOMS: Do you have any other symptoms? (e.g., earache, headache, numbness, tinnitus, vomiting, weakness)     Right ear is clogged. nausea  Protocols used: Dizziness - Vertigo-A-AH

## 2024-08-29 ENCOUNTER — Ambulatory Visit: Admitting: Physician Assistant

## 2024-08-30 ENCOUNTER — Ambulatory Visit

## 2024-08-30 VITALS — BP 119/82 | HR 84 | Resp 16 | Ht 63.0 in | Wt 135.0 lb

## 2024-08-30 DIAGNOSIS — H6991 Unspecified Eustachian tube disorder, right ear: Secondary | ICD-10-CM | POA: Diagnosis not present

## 2024-08-30 DIAGNOSIS — H8113 Benign paroxysmal vertigo, bilateral: Secondary | ICD-10-CM | POA: Diagnosis not present

## 2024-08-30 MED ORDER — MECLIZINE HCL 25 MG PO TABS: 25.0000 mg | ORAL_TABLET | Freq: Three times a day (TID) | ORAL | 1 refills | Status: AC | PRN

## 2024-08-30 MED ORDER — FLUTICASONE PROPIONATE 50 MCG/ACT NA SUSP: 1.0000 | Freq: Every day | NASAL | 12 refills | Status: AC

## 2024-08-30 NOTE — Progress Notes (Signed)
 Acute visit   Patient: Ellen Montoya   DOB: 1956-02-17   68 y.o. Female  MRN: [id number] PCP: Donzella Lauraine SAILOR, DO   Chief Complaint  Patient presents with   Acute Visit    Dizziness/ ( several week) Lightheaded /nausa   Subjective    Discussed the use of AI scribe software for clinical note transcription with the patient, who gave verbal consent to proceed.  History of Present Illness Ellen Montoya is a 68 year old female with a history of vertigo who presents with episodic dizziness and ear congestion.  She has been experiencing intermittent dizziness for a couple of weeks, which she initially attributed to her known vertigo, often triggered by movements such as turning over in bed. However, this episode feels different as it is associated with right-sided ear congestion, particularly when yawning, and a feeling of stuffiness. She reports feeling lightheaded and queasy, but there is no recent illness.  She has tried Dramamine, which usually helps with her vertigo, but it has not been effective this time. Sudafed has provided some relief during the day. Her right ear feels clogged. There is no ear pain.  She has a history of bronchitis and sinus infections and notes that dizziness is a common symptom when she is unwell. Her equilibrium is easily disturbed, but she does not experience falls or significant balance issues while walking. She can perform her work duties but sometimes needs to adjust her posture to avoid dizziness.  Her current medications include Sudafed as needed, Aleve occasionally, and omeprazole for acid reflux as needed. She also takes vitamin D  and calcium  supplements. She denies regular use of other medications and has no recent history of illness or allergies.  Review of systems as noted in HPI.   Objective    BP 119/82 (BP Location: Left Arm, Patient Position: Sitting, Cuff Size: Normal)   Pulse 84   Resp 16   Ht 5' 3 (1.6 m)   Wt 135 lb (61.2 kg)    SpO2 97%   BMI 23.91 kg/m  Physical Exam Constitutional:      Appearance: Normal appearance.  HENT:     Head: Normocephalic and atraumatic.     Right Ear: Hearing, ear canal and external ear normal. A middle ear effusion is present. There is no impacted cerumen. Tympanic membrane is not erythematous or bulging.     Left Ear: Hearing, tympanic membrane, ear canal and external ear normal.     Mouth/Throat:     Mouth: Mucous membranes are moist.  Eyes:     Pupils: Pupils are equal, round, and reactive to light.  Pulmonary:     Effort: Pulmonary effort is normal.  Skin:    General: Skin is warm.  Neurological:     General: No focal deficit present.     Mental Status: She is alert.       No results found for any visits on 08/30/24.  Assessment & Plan     Problem List Items Addressed This Visit   None Visit Diagnoses       Dysfunction of right eustachian tube    -  Primary   Relevant Medications   fluticasone (FLONASE) 50 MCG/ACT nasal spray     Benign paroxysmal positional vertigo due to bilateral vestibular disorder       Relevant Medications   meclizine (ANTIVERT) 25 MG tablet      Assessment & Plan Benign paroxysmal positional vertigo Chronic, uncontrolled. Intermittent dizziness  exacerbated by head movements, suggestive of BPPV due to dislodged otoliths.  - Prescribed meclizine as needed for dizziness. - Provided instructions for the Epley maneuver. - Instructed to report if symptoms persist after one week.  Eustachian tube dysfunction, right ear Fluid behind right ear without infection, likely contributing to dizziness. - Prescribed Flonase nasal spray, one spray in each nostril daily. - Instructed on proper nasal spray technique. - Advised to monitor for signs of sinus infection or worsening symptoms.  Meds ordered this encounter  Medications   fluticasone (FLONASE) 50 MCG/ACT nasal spray    Sig: Place 1 spray into both nostrils daily.    Dispense:  16 g     Refill:  12   meclizine (ANTIVERT) 25 MG tablet    Sig: Take 1 tablet (25 mg total) by mouth 3 (three) times daily as needed for dizziness or nausea.    Dispense:  30 tablet    Refill:  1     No follow-ups on file.      Isaiah DELENA Pepper, MD  [facility] [phone] (phone) [phone] (fax)

## 2024-08-31 ENCOUNTER — Ambulatory Visit

## 2024-08-31 DIAGNOSIS — L649 Androgenic alopecia, unspecified: Secondary | ICD-10-CM | POA: Diagnosis not present

## 2024-08-31 DIAGNOSIS — L658 Other specified nonscarring hair loss: Secondary | ICD-10-CM

## 2024-08-31 MED ORDER — MINOXIDIL 2.5 MG PO TABS: ORAL_TABLET | ORAL | 3 refills | Status: AC

## 2024-08-31 NOTE — Progress Notes (Signed)
°  °  Subjective   Ellen Montoya is a 68 y.o. female who presents for the following: alopecia. Patient is new patient  Today patient reports: Patient states her hair has been thinning and falling out, started over a year ago, recently occurring more within the last 6 months.  She uses viviscal shampoo and conditioner. She advised her PCP about her concern and they proceeded with routine blood work and found her labs to be low in vitamin D .  Review of Systems:    No other skin or systemic complaints except as noted in HPI or Assessment and Plan.  The following portions of the chart were reviewed this encounter and updated as appropriate: medications, allergies, medical history  Relevant Medical History:  n/a   Objective  (SKPE) Well appearing patient in no apparent distress; mood and affect are within normal limits. Examination was performed of the: Focused Exam of: scalp   Examination notable for: - Thinning of hair on frontal and vertex of the scalp with retention of frontal hairline and widening of the part.  No erythema, no scale, no scarring, no papules, and no pustules on exam today. Examination limited by: Undergarments, Shoes or socks , and Clothing     Assessment & Plan  (SKAP)   Androgenetic alopecia - mild Chronic and persistent condition with duration or expected duration over one year. Condition is symptomatic and bothersome to patient. Patient is flaring and not currently at treatment goal.  - Discussed the nature of this condition that will progress over time - Detailed discussion about therapeutic options including use of finasteride, dutasteride and oral minoxidil and topical minoxidil      Plan:  Minoxidil 5% twice daily. Educated about proper use, including ensuring the medication is applied directly to the scalp. Discusssed that minoxidil can initially cause some hair shedding. Reviewed that expectation and goal of treatment is to prevent further loss, rather than  hair regrowth. Minoxidil 1.25 mg daily Screened for cardiac, renal, or hepatic disease - denies  No BP issues  Side effects: Hypotension, headaches, hypertrichosis, leg swelling, rash. Expect results in 6 months.     Level of service outlined above   Patient instructions (SKPI)   Procedures, orders, diagnosis for this visit:    There are no diagnoses linked to this encounter.  Return to clinic: Return for 4-6 months , w/ Dr. Raymund.  I, Almetta Nora, RMA, am acting as scribe for Lauraine JAYSON Raymund, MD .   Documentation: I have reviewed the above documentation for accuracy and completeness, and I agree with the above.  Lauraine JAYSON Raymund, MD

## 2024-08-31 NOTE — Patient Instructions (Addendum)
 Start oral minoxidil 1.25 mg daily (1/2 tab)   Begin Minoxidil 5% foam (or solution) otherwise known as Rogaine - this is often called Mens Strength - but we use it exclusively in men and women. You are going to apply this once or twice daily in areas where you feel are thinner than the remainder.  It can take up to 6-8 months of applying this medication to notice an improvement.  You will need to use this long term to maintain the results of increased hair growth.    -Advised patient to stop using Biotin.  Due to recent changes in healthcare laws, you may see results of your pathology and/or laboratory studies on MyChart before the doctors have had a chance to review them. We understand that in some cases there may be results that are confusing or concerning to you. Please understand that not all results are received at the same time and often the doctors may need to interpret multiple results in order to provide you with the best plan of care or course of treatment. Therefore, we ask that you please give us  2 business days to thoroughly review all your results before contacting the office for clarification. Should we see a critical lab result, you will be contacted sooner.   If You Need Anything After Your Visit  If you have any questions or concerns for your doctor, please call our main line at [phone] and press option 4 to reach your doctor's medical assistant. If no one answers, please leave a voicemail as directed and we will return your call as soon as possible. Messages left after 4 pm will be answered the following business day.   You may also send us  a message via MyChart. We typically respond to MyChart messages within 1-2 business days.  For prescription refills, please ask your pharmacy to contact our office. Our fax number is [phone].  If you have an urgent issue when the clinic is closed that cannot wait until the next business day, you can page your doctor at the  number below.    Please note that while we do our best to be available for urgent issues outside of office hours, we are not available 24/7.   If you have an urgent issue and are unable to reach us , you may choose to seek medical care at your doctor's office, retail clinic, urgent care center, or emergency room.  If you have a medical emergency, please immediately call 911 or go to the emergency department.  Pager Numbers  - Dr. Hester: [phone]  - Dr. Jackquline: [phone]  - Dr. Claudene: [phone]   - Dr. Raymund: [phone]  In the event of inclement weather, please call our main line at [phone] for an update on the status of any delays or closures.  Dermatology Medication Tips: Please keep the boxes that topical medications come in in order to help keep track of the instructions about where and how to use these. Pharmacies typically print the medication instructions only on the boxes and not directly on the medication tubes.   If your medication is too expensive, please contact our office at [phone] option 4 or send us  a message through MyChart.   We are unable to tell what your co-pay for medications will be in advance as this is different depending on your insurance coverage. However, we may be able to find a substitute medication at lower cost or fill out paperwork to get insurance to cover a needed medication.  If a prior authorization is required to get your medication covered by your insurance company, please allow us  1-2 business days to complete this process.  Drug prices often vary depending on where the prescription is filled and some pharmacies may offer cheaper prices.  The website www.goodrx.com contains coupons for medications through different pharmacies. The prices here do not account for what the cost may be with help from insurance (it may be cheaper with your insurance), but the website can give you the price if you did not use any insurance.   - You can print the associated coupon and take it with your prescription to the pharmacy.  - You may also stop by our office during regular business hours and pick up a GoodRx coupon card.  - If you need your prescription sent electronically to a different pharmacy, notify our office through [facility] or by phone at [phone] option 4.     Si Usted Necesita Algo Despus de Su Visita  Tambin puede enviarnos un mensaje a travs de Clinical Cytogeneticist. Por lo general respondemos a los mensajes de MyChart en el transcurso de 1 a 2 das hbiles.  Para renovar recetas, por favor pida a su farmacia que se ponga en contacto con nuestra oficina. Randi lakes de fax es [place] [phone].  Si tiene un asunto urgente cuando la clnica est cerrada y que no puede esperar hasta el siguiente da hbil, puede llamar/localizar a su doctor(a) al nmero que aparece a continuacin.   Por favor, tenga en cuenta que aunque hacemos todo lo posible para estar disponibles para asuntos urgentes fuera del horario de [place], no estamos disponibles las 24 horas del da, los 7 [address]  [address] de la [place].   Si tiene un problema urgente y no puede comunicarse con nosotros, puede optar por buscar atencin mdica  en el consultorio de su doctor(a), en una clnica privada, en un centro de atencin urgente o en una sala de emergencias.  Si tiene engineer, drilling, por favor llame inmediatamente al 911 o vaya a la sala de emergencias.  Nmeros de bper  - Dr. Hester: [phone]  - Dra. Jackquline: [phone]  - Dr. Claudene: [phone]  - Dra. Kitts: [phone]  En caso de inclemencias del [place], por favor llame a nuestra lnea principal al [phone] para una actualizacin sobre el estado de cualquier retraso o cierre.  Consejos para la medicacin en dermatologa: Por favor, guarde las cajas en las que vienen los medicamentos de uso tpico para ayudarle a seguir las instrucciones sobre dnde y cmo usarlos. Las  farmacias generalmente imprimen las instrucciones del medicamento slo en las cajas y no directamente en los tubos del [place].   Si su medicamento es muy caro, por favor, pngase en contacto con landry rieger llamando al [phone] y presione la opcin 4 o envenos un mensaje a travs de Clinical Cytogeneticist.   No podemos decirle cul ser su copago por los medicamentos por adelantado ya que esto es diferente dependiendo de la cobertura de su seguro. Sin embargo, es posible que podamos encontrar un medicamento sustituto a audiological scientist un formulario para que el seguro cubra el medicamento que se considera necesario.   Si se requiere una autorizacin previa para que su compaa de seguros cubra su medicamento, por favor permtanos de 1 a 2 das hbiles para completar este proceso.  Los precios de los medicamentos varan con frecuencia dependiendo del environmental consultant de dnde se surte la receta y alguna farmacias pueden ofrecer precios ms baratos.  El sitio web www.goodrx.com tiene cupones para medicamentos de health and safety inspector. Los precios aqu no tienen en cuenta lo que podra costar con la ayuda del seguro (puede ser ms barato con su seguro), pero el sitio web puede darle el precio si no utiliz tourist information centre manager.  - Puede imprimir el cupn correspondiente y llevarlo con su receta a la farmacia.  - Tambin puede pasar por nuestra oficina durante el horario de atencin regular y education officer, museum una tarjeta de cupones de GoodRx.  - Si necesita que su receta se enve electrnicamente a una farmacia diferente, informe a nuestra oficina a travs de MyChart de [place] o por telfono llamando al [phone] y presione la opcin 4.

## 2024-12-28 ENCOUNTER — Ambulatory Visit
